# Patient Record
Sex: Female | Born: 1945 | Race: White | Smoking: Current every day smoker
Health system: Southern US, Community
[De-identification: ages and names within clinical notes are randomized; demographics above are authoritative.]

## PROBLEM LIST (undated history)

## (undated) DIAGNOSIS — J45909 Unspecified asthma, uncomplicated: Secondary | ICD-10-CM

## (undated) DIAGNOSIS — J449 Chronic obstructive pulmonary disease, unspecified: Secondary | ICD-10-CM

## (undated) DIAGNOSIS — I10 Essential (primary) hypertension: Secondary | ICD-10-CM

---

## 2015-03-24 ENCOUNTER — Other Ambulatory Visit: Payer: Self-pay | Admitting: Nurse Practitioner

## 2015-03-24 DIAGNOSIS — Z0000 Encounter for general adult medical examination without abnormal findings: Secondary | ICD-10-CM

## 2016-02-16 ENCOUNTER — Other Ambulatory Visit: Payer: Self-pay | Admitting: Nurse Practitioner

## 2016-02-16 DIAGNOSIS — Z1239 Encounter for other screening for malignant neoplasm of breast: Secondary | ICD-10-CM

## 2016-10-07 ENCOUNTER — Emergency Department: Payer: Medicare Other

## 2016-10-07 ENCOUNTER — Encounter: Payer: Self-pay | Admitting: Emergency Medicine

## 2016-10-07 ENCOUNTER — Emergency Department
Admission: EM | Admit: 2016-10-07 | Discharge: 2016-10-07 | Disposition: A | Discharge status: Home / Self Care | Payer: Medicare Other | Attending: Emergency Medicine | Admitting: Emergency Medicine

## 2016-10-07 DIAGNOSIS — Y999 Unspecified external cause status: Secondary | ICD-10-CM | POA: Diagnosis not present

## 2016-10-07 DIAGNOSIS — Y929 Unspecified place or not applicable: Secondary | ICD-10-CM | POA: Insufficient documentation

## 2016-10-07 DIAGNOSIS — S6991XA Unspecified injury of right wrist, hand and finger(s), initial encounter: Secondary | ICD-10-CM | POA: Diagnosis present

## 2016-10-07 DIAGNOSIS — S52501A Unspecified fracture of the lower end of right radius, initial encounter for closed fracture: Secondary | ICD-10-CM

## 2016-10-07 DIAGNOSIS — W03XXXA Other fall on same level due to collision with another person, initial encounter: Secondary | ICD-10-CM | POA: Insufficient documentation

## 2016-10-07 DIAGNOSIS — Y9301 Activity, walking, marching and hiking: Secondary | ICD-10-CM | POA: Diagnosis not present

## 2016-10-07 DIAGNOSIS — I10 Essential (primary) hypertension: Secondary | ICD-10-CM | POA: Insufficient documentation

## 2016-10-07 DIAGNOSIS — F17210 Nicotine dependence, cigarettes, uncomplicated: Secondary | ICD-10-CM | POA: Diagnosis not present

## 2016-10-07 DIAGNOSIS — S52591A Other fractures of lower end of right radius, initial encounter for closed fracture: Secondary | ICD-10-CM | POA: Insufficient documentation

## 2016-10-07 HISTORY — DX: Essential (primary) hypertension: I10

## 2016-10-07 MED ORDER — OXYCODONE-ACETAMINOPHEN 5-325 MG PO TABS
1.0000 | ORAL_TABLET | Freq: Once | ORAL | Status: AC
  Administered 2016-10-07: 1 via ORAL
  Filled 2016-10-07: qty 1

## 2016-10-07 MED ORDER — OXYCODONE-ACETAMINOPHEN 7.5-325 MG PO TABS
1.0000 | ORAL_TABLET | ORAL | 0 refills | Status: AC | PRN
Start: 2016-10-07 — End: 2017-10-07

## 2016-10-07 NOTE — ED Provider Notes (Signed)
[facility] Emergency Department Provider Note ____________________________________________  Time seen: Approximately 11:10 PM  I have reviewed the triage vital signs and the nursing notes.   HISTORY  Chief Complaint Wrist Pain   HPI Mariah Nixon is a 70 y.o. female that presents with right arm pain after falling on arm one week ago while walking neighbor's dogs. Patient states that dogs pulled her down while on a walk. Patient's son helped her stand up. Patient has been moving arm normally all week. Patient states that swelling and bruising have been getting worse all week. Patient has normal sensation in fingers. Patient denies numbness or tingling. Patient's sister was concerned and brought patient into ED. Patient denies hitting head.Patient did not loose consciousness. Patient has not taken anything for pain.  Past Medical History:  Diagnosis Date  . Hypertension     There are no active problems to display for this patient.   History reviewed. No pertinent surgical history.  Prior to Admission medications   Medication Sig Start Date End Date Taking? Authorizing Provider  oxyCODONE-acetaminophen (PERCOCET) 7.5-325 MG tablet Take 1 tablet by mouth every 4 (four) hours as needed for severe pain. 10/07/16 10/07/17  Enid DerryAshley Aella Ronda, PA-C    Allergies Patient has no known allergies.  No family history on file.  Social History Social History  Substance Use Topics  . Smoking status: Current Every Day Smoker    Packs/day: 1.00    Types: Cigarettes  . Smokeless tobacco: Not on file  . Alcohol use Not on file    Review of Systems Constitutional: No recent illness. Cardiovascular: Denies chest pain or palpitations. Respiratory: Denies shortness of breath. Abdomen: Negative for abdominal pain. Musculoskeletal: Pain in right arm. Skin: Negative for rash, wound, lesion. Positive for bruising.  Neurological: Negative for tingling or numbness. No  headaches.  ____________________________________________   PHYSICAL EXAM:  VITAL SIGNS: ED Triage Vitals  Enc Vitals Group     BP 10/07/16 1857 (!) 148/73     Pulse Rate 10/07/16 1857 (!) 104     Resp 10/07/16 1857 20     Temp 10/07/16 1857 98.4 F (36.9 C)     Temp Source 10/07/16 1857 Oral     SpO2 10/07/16 1857 95 %     Weight 10/07/16 1858 79 lb (35.8 kg)     Height 10/07/16 1858 4\' 9"  (1.448 m)     Head Circumference --      Peak Flow --      Pain Score 10/07/16 1858 9     Pain Loc --      Pain Edu? --      Excl. in GC? --     Constitutional: Alert and oriented. Well appearing and in no acute distress. Eyes: Conjunctivae are normal. EOMI. Head: Atraumatic. Neck: No stridor.  Cardiovascular: Regular rate and rhythm. 2+ radial pulses bilaterally.  Respiratory: Normal respiratory effort.  Lungs CTAB. Musculoskeletal: Moderate swelling in right hand. Bruising over right forearm. Patient able to move fingers and elbow normally. Limited ROM of right wrist. Tenderness to palpation over right wrist. Neurologic:  Normal speech and language. No gross focal neurologic deficits are appreciated. Sensation in right arm and hand intact. Speech is normal.  Skin:  Skin is warm, dry and intact. Mild swelling in right hand. Psychiatric: Mood and affect are normal. Speech and behavior are normal.  ____________________________________________   LABS (all labs ordered are listed, but only abnormal results are displayed)  Labs Reviewed - No data  to display ____________________________________________  RADIOLOGY  Lexine BatonI, Teryn Gust, personally viewed and evaluated these images (plain radiographs) as part of my medical decision making, as well as reviewing the written report by the radiologist.  Xray findings per radiology. Comminuted fracture of the distal radius, with mild impaction and  dorsal angulation.     PROCEDURES    INITIAL IMPRESSION / ASSESSMENT AND PLAN / ED  COURSE  Clinical Course     Pertinent labs & imaging results that were available during my care of the patient were reviewed by me and considered in my medical decision making (see chart for details).  Patient has fracture of the distal radius, with mild impaction and dorsal angulation. Pulses and sensation are intact. Pulse and blood pressure are likely secondary to pain. Swelling of hand is likely from patient continuing to use hand all week. Patient was given splint and sling in ED. Patient was instructed not to remove sling or splint. Patient was given Percocet for pain. Patient was instructed to follow-up with ortho on Monday. Patient was instructed to return to ED for any sensation changes and hand. ____________________________________________   FINAL CLINICAL IMPRESSION(S) / ED DIAGNOSES  Final diagnoses:  Closed fracture of distal end of right radius, unspecified fracture morphology, initial encounter      Enid Derryshley Thierry Dobosz, PA-C 10/08/16 0009    Minna AntisKevin Paduchowski, MD 10/08/16 2237

## 2016-10-07 NOTE — ED Triage Notes (Signed)
Larey SeatFell one week ago. Pain R wrist. Wrist noted swollen and bruised.

## 2016-10-07 NOTE — Discharge Instructions (Signed)
Keep wrist in splint and sling. Call for an appointment with Ortho on Monday. Return to ED for any sensation changes in fingers or any change in symptoms.

## 2017-08-24 ENCOUNTER — Other Ambulatory Visit: Payer: Self-pay | Admitting: Nurse Practitioner

## 2017-08-24 DIAGNOSIS — Z1239 Encounter for other screening for malignant neoplasm of breast: Secondary | ICD-10-CM

## 2017-08-24 DIAGNOSIS — Z13820 Encounter for screening for osteoporosis: Secondary | ICD-10-CM

## 2018-10-23 ENCOUNTER — Other Ambulatory Visit: Payer: Self-pay | Admitting: Nurse Practitioner

## 2018-10-23 DIAGNOSIS — Z1239 Encounter for other screening for malignant neoplasm of breast: Secondary | ICD-10-CM

## 2018-10-23 DIAGNOSIS — Z1231 Encounter for screening mammogram for malignant neoplasm of breast: Secondary | ICD-10-CM

## 2018-10-23 DIAGNOSIS — M810 Age-related osteoporosis without current pathological fracture: Secondary | ICD-10-CM

## 2018-10-24 ENCOUNTER — Telehealth: Payer: Self-pay | Admitting: *Deleted

## 2018-10-24 DIAGNOSIS — Z122 Encounter for screening for malignant neoplasm of respiratory organs: Secondary | ICD-10-CM

## 2018-10-24 DIAGNOSIS — Z87891 Personal history of nicotine dependence: Secondary | ICD-10-CM

## 2018-10-24 NOTE — Telephone Encounter (Signed)
Received referral for initial lung cancer screening scan. Contacted patient and obtained smoking history,(current, 41 pack year) as well as answering questions related to screening process. Patient denies signs of lung cancer such as weight loss or hemoptysis. Patient denies comorbidity that would prevent curative treatment if lung cancer were found. Patient is scheduled for shared decision making visit and CT scan on 11/12/18 at 115pm.

## 2018-11-12 ENCOUNTER — Inpatient Hospital Stay: Payer: Medicare Other | Attending: Nurse Practitioner | Admitting: Nurse Practitioner

## 2018-11-12 ENCOUNTER — Encounter: Payer: Self-pay | Admitting: Nurse Practitioner

## 2018-11-12 ENCOUNTER — Ambulatory Visit
Admission: RE | Admit: 2018-11-12 | Discharge: 2018-11-12 | Disposition: A | Discharge status: Home / Self Care | Payer: Medicare Other | Source: Ambulatory Visit | Attending: Nurse Practitioner | Admitting: Nurse Practitioner

## 2018-11-12 DIAGNOSIS — Z122 Encounter for screening for malignant neoplasm of respiratory organs: Secondary | ICD-10-CM | POA: Diagnosis not present

## 2018-11-12 DIAGNOSIS — Z87891 Personal history of nicotine dependence: Secondary | ICD-10-CM | POA: Diagnosis present

## 2018-11-12 NOTE — Progress Notes (Signed)
In accordance with CMS guidelines, patient has met eligibility criteria including age, absence of signs or symptoms of lung cancer.  Social History   Tobacco Use  . Smoking status: Current Every Day Smoker    Packs/day: 1.00    Years: 41.00    Pack years: 41.00    Types: Cigarettes  Substance Use Topics  . Alcohol use: Not on file  . Drug use: Not on file      A shared decision-making session was conducted prior to the performance of CT scan. This includes one or more decision aids, includes benefits and harms of screening, follow-up diagnostic testing, over-diagnosis, false positive rate, and total radiation exposure.   Counseling on the importance of adherence to annual lung cancer LDCT screening, impact of co-morbidities, and ability or willingness to undergo diagnosis and treatment is imperative for compliance of the program.   Counseling on the importance of continued smoking cessation for former smokers; the importance of smoking cessation for current smokers, and information about tobacco cessation interventions have been given to patient including [place] and [employer] quit [place] programs.   Written order for lung cancer screening with LDCT has been given to the patient and any and all questions have been answered to the best of my abilities.    Yearly follow up will be coordinated by Burgess Estelle, Thoracic Navigator.  Beckey Rutter, DNP, AGNP-C [place] at [facility] [phone] (work cell) [phone] (office) 11/12/18 2:24 PM

## 2018-11-14 ENCOUNTER — Telehealth: Payer: Self-pay | Admitting: *Deleted

## 2018-11-14 NOTE — Telephone Encounter (Signed)
Notified patient of LDCT lung cancer screening program results with recommendation short term follow up imaging after appropriate treatment. Also faxed report to PCP and notified their office of results.   IMPRESSION:  1. Lung-RADS 0S, incomplete. Today's study is considered  nondiagnostic for purposes of lung cancer screening by the presence  of an apparent multilobar bronchopneumonia. Outpatient antimicrobial  treatment is recommended, followed by repeat lung cancer screening  examination in 3 months after complete resolution of the patient's  symptoms and normalization of the patient's chest radiograph.  2. The "S" modifier above refers to potentially clinically  significant non lung cancer related findings. Specifically, there is  aortic atherosclerosis, in addition to left main and 3 vessel  coronary artery disease. Assessment for potential risk factor  modification, dietary therapy or pharmacologic therapy may be  warranted, if clinically indicated.  3. There are calcifications of the mitral annulus. Echocardiographic  correlation for evaluation of potential valvular dysfunction may be  warranted if clinically indicated.  Aortic Atherosclerosis (ICD10-I70.0).

## 2018-11-20 ENCOUNTER — Telehealth: Payer: Self-pay

## 2018-11-20 NOTE — Telephone Encounter (Signed)
L MOM to schedule appt per PCP

## 2018-12-03 NOTE — Telephone Encounter (Signed)
lmov to schedule appt  °

## 2018-12-07 ENCOUNTER — Telehealth: Payer: Self-pay

## 2018-12-07 NOTE — Telephone Encounter (Signed)
Call pt regarding lung screening. Pt has no transportation. Pt would like for Shawn to call her before scheduling this appt.  I explain that sometimes we maybe able to place pts on our van as a 1 time event. Pt is a current smoker, smoking about 1/2 pack per day. Would like to have scan in the afternoon. Pt denies any new health issues.

## 2018-12-11 ENCOUNTER — Encounter: Payer: Self-pay | Admitting: *Deleted

## 2018-12-11 ENCOUNTER — Telehealth: Payer: Self-pay | Admitting: *Deleted

## 2018-12-11 DIAGNOSIS — Z122 Encounter for screening for malignant neoplasm of respiratory organs: Secondary | ICD-10-CM

## 2018-12-11 NOTE — Telephone Encounter (Signed)
Patient has been notified that the annual lung cancer screening low dose CT scan is due currently or will be in the near future.  Confirmed that the patient is within the age range of 79-80, and asymptomatic, and currently exhibits no signs or symptoms of lung cancer.  Patient denies illness that would prevent curative treatment for lung cancer if found.  Verified smoking history, current smoker 0.5ppd with 42pkyr history.  The shared decision making visit was completed on 11-12-18.  Patient is agreeable for the CT scan to be scheduled.  Will call patient back with date and time of appointment.

## 2018-12-11 NOTE — Telephone Encounter (Signed)
Called pt to inform her of her appt for ldct screening on Thursday 12/26/2018 here @ [facility] @ 2:15pm and that the cancer center [facility] will pick you up at 1330, unable to reach patient at this time, voice mail left and appt mailed to patient.

## 2018-12-13 NOTE — Telephone Encounter (Signed)
Unable to contact mailed letter removing from wq.  Faxed to pcp office to notify

## 2018-12-26 ENCOUNTER — Ambulatory Visit
Admission: RE | Admit: 2018-12-26 | Discharge: 2018-12-26 | Disposition: A | Discharge status: Home / Self Care | Payer: Medicare Other | Source: Ambulatory Visit | Attending: Nurse Practitioner | Admitting: Nurse Practitioner

## 2018-12-30 ENCOUNTER — Telehealth: Payer: Self-pay | Admitting: *Deleted

## 2018-12-30 NOTE — Telephone Encounter (Signed)
Voicemail left in attempt to reschedule no show appointment for lung screening follow up imaging.

## 2019-01-04 ENCOUNTER — Telehealth: Payer: Self-pay | Admitting: *Deleted

## 2019-01-04 ENCOUNTER — Encounter: Payer: Self-pay | Admitting: *Deleted

## 2019-01-04 NOTE — Telephone Encounter (Signed)
Attempted to contact patient r/t LDCT Screening follow up due at this time.  No answer received, message left for patient to call [phone] to schedule appointment.  Letter mailed r/t inability to reach and no show to appt.

## 2019-02-17 ENCOUNTER — Other Ambulatory Visit: Payer: Self-pay

## 2019-02-17 ENCOUNTER — Emergency Department: Payer: Medicare Other

## 2019-02-17 ENCOUNTER — Inpatient Hospital Stay
Admission: EM | Admit: 2019-02-17 | Discharge: 2019-03-14 | DRG: 193 | Disposition: E | Payer: Medicare Other | Attending: Internal Medicine | Admitting: Internal Medicine

## 2019-02-17 DIAGNOSIS — Z515 Encounter for palliative care: Secondary | ICD-10-CM | POA: Diagnosis not present

## 2019-02-17 DIAGNOSIS — Z79899 Other long term (current) drug therapy: Secondary | ICD-10-CM

## 2019-02-17 DIAGNOSIS — J189 Pneumonia, unspecified organism: Principal | ICD-10-CM | POA: Diagnosis present

## 2019-02-17 DIAGNOSIS — J9621 Acute and chronic respiratory failure with hypoxia: Secondary | ICD-10-CM | POA: Diagnosis not present

## 2019-02-17 DIAGNOSIS — F05 Delirium due to known physiological condition: Secondary | ICD-10-CM | POA: Diagnosis not present

## 2019-02-17 DIAGNOSIS — N179 Acute kidney failure, unspecified: Secondary | ICD-10-CM | POA: Diagnosis not present

## 2019-02-17 DIAGNOSIS — M810 Age-related osteoporosis without current pathological fracture: Secondary | ICD-10-CM | POA: Diagnosis present

## 2019-02-17 DIAGNOSIS — E871 Hypo-osmolality and hyponatremia: Secondary | ICD-10-CM | POA: Diagnosis present

## 2019-02-17 DIAGNOSIS — E872 Acidosis: Secondary | ICD-10-CM | POA: Diagnosis not present

## 2019-02-17 DIAGNOSIS — M47816 Spondylosis without myelopathy or radiculopathy, lumbar region: Secondary | ICD-10-CM | POA: Diagnosis present

## 2019-02-17 DIAGNOSIS — R64 Cachexia: Secondary | ICD-10-CM | POA: Diagnosis present

## 2019-02-17 DIAGNOSIS — M419 Scoliosis, unspecified: Secondary | ICD-10-CM | POA: Diagnosis present

## 2019-02-17 DIAGNOSIS — E43 Unspecified severe protein-calorie malnutrition: Secondary | ICD-10-CM | POA: Diagnosis present

## 2019-02-17 DIAGNOSIS — Z8701 Personal history of pneumonia (recurrent): Secondary | ICD-10-CM | POA: Diagnosis not present

## 2019-02-17 DIAGNOSIS — E222 Syndrome of inappropriate secretion of antidiuretic hormone: Secondary | ICD-10-CM | POA: Diagnosis present

## 2019-02-17 DIAGNOSIS — F1010 Alcohol abuse, uncomplicated: Secondary | ICD-10-CM | POA: Diagnosis present

## 2019-02-17 DIAGNOSIS — J9602 Acute respiratory failure with hypercapnia: Secondary | ICD-10-CM | POA: Diagnosis not present

## 2019-02-17 DIAGNOSIS — M25551 Pain in right hip: Secondary | ICD-10-CM | POA: Diagnosis present

## 2019-02-17 DIAGNOSIS — I361 Nonrheumatic tricuspid (valve) insufficiency: Secondary | ICD-10-CM | POA: Diagnosis not present

## 2019-02-17 DIAGNOSIS — Z66 Do not resuscitate: Secondary | ICD-10-CM | POA: Diagnosis not present

## 2019-02-17 DIAGNOSIS — G9340 Encephalopathy, unspecified: Secondary | ICD-10-CM | POA: Diagnosis not present

## 2019-02-17 DIAGNOSIS — J440 Chronic obstructive pulmonary disease with acute lower respiratory infection: Secondary | ICD-10-CM | POA: Diagnosis present

## 2019-02-17 DIAGNOSIS — J9690 Respiratory failure, unspecified, unspecified whether with hypoxia or hypercapnia: Secondary | ICD-10-CM

## 2019-02-17 DIAGNOSIS — Z6821 Body mass index (BMI) 21.0-21.9, adult: Secondary | ICD-10-CM

## 2019-02-17 DIAGNOSIS — R0689 Other abnormalities of breathing: Secondary | ICD-10-CM

## 2019-02-17 DIAGNOSIS — I10 Essential (primary) hypertension: Secondary | ICD-10-CM | POA: Diagnosis present

## 2019-02-17 DIAGNOSIS — Z886 Allergy status to analgesic agent status: Secondary | ICD-10-CM

## 2019-02-17 DIAGNOSIS — Z716 Tobacco abuse counseling: Secondary | ICD-10-CM

## 2019-02-17 DIAGNOSIS — J441 Chronic obstructive pulmonary disease with (acute) exacerbation: Secondary | ICD-10-CM | POA: Diagnosis present

## 2019-02-17 DIAGNOSIS — F17210 Nicotine dependence, cigarettes, uncomplicated: Secondary | ICD-10-CM | POA: Diagnosis present

## 2019-02-17 DIAGNOSIS — J9622 Acute and chronic respiratory failure with hypercapnia: Secondary | ICD-10-CM | POA: Diagnosis not present

## 2019-02-17 DIAGNOSIS — K0889 Other specified disorders of teeth and supporting structures: Secondary | ICD-10-CM | POA: Diagnosis present

## 2019-02-17 DIAGNOSIS — R0602 Shortness of breath: Secondary | ICD-10-CM

## 2019-02-17 DIAGNOSIS — I340 Nonrheumatic mitral (valve) insufficiency: Secondary | ICD-10-CM | POA: Diagnosis not present

## 2019-02-17 DIAGNOSIS — Z7189 Other specified counseling: Secondary | ICD-10-CM | POA: Diagnosis not present

## 2019-02-17 HISTORY — DX: Unspecified asthma, uncomplicated: J45.909

## 2019-02-17 HISTORY — DX: Chronic obstructive pulmonary disease, unspecified: J44.9

## 2019-02-17 LAB — COMPREHENSIVE METABOLIC PANEL
ALT: 13 U/L (ref 0–44)
AST: 26 U/L (ref 15–41)
Albumin: 3.2 g/dL — ABNORMAL LOW (ref 3.5–5.0)
Alkaline Phosphatase: 115 U/L (ref 38–126)
Anion gap: 14 (ref 5–15)
BUN: 6 mg/dL — ABNORMAL LOW (ref 8–23)
CO2: 23 mmol/L (ref 22–32)
Calcium: 8.6 mg/dL — ABNORMAL LOW (ref 8.9–10.3)
Chloride: 75 mmol/L — ABNORMAL LOW (ref 98–111)
Creatinine, Ser: <0.3 mg/dL — ABNORMAL LOW (ref 0.44–1.00)
Glucose, Bld: 105 mg/dL — ABNORMAL HIGH (ref 70–99)
Potassium: 4.2 mmol/L (ref 3.5–5.1)
Sodium: 112 mmol/L — CL (ref 135–145)
Total Bilirubin: 1.4 mg/dL — ABNORMAL HIGH (ref 0.3–1.2)
Total Protein: 7 g/dL (ref 6.5–8.1)

## 2019-02-17 LAB — CBC WITH DIFFERENTIAL/PLATELET
Abs Immature Granulocytes: 0.12 10*3/uL — ABNORMAL HIGH (ref 0.00–0.07)
Basophils Absolute: 0.1 10*3/uL (ref 0.0–0.1)
Basophils Relative: 0 %
Eosinophils Absolute: 0.1 10*3/uL (ref 0.0–0.5)
Eosinophils Relative: 1 %
HCT: 37.6 % (ref 36.0–46.0)
Hemoglobin: 13.6 g/dL (ref 12.0–15.0)
Immature Granulocytes: 1 %
Lymphocytes Relative: 8 %
Lymphs Abs: 1.1 10*3/uL (ref 0.7–4.0)
MCH: 32.5 pg (ref 26.0–34.0)
MCHC: 36.2 g/dL — ABNORMAL HIGH (ref 30.0–36.0)
MCV: 89.7 fL (ref 80.0–100.0)
Monocytes Absolute: 1.8 10*3/uL — ABNORMAL HIGH (ref 0.1–1.0)
Monocytes Relative: 14 %
Neutro Abs: 10.1 10*3/uL — ABNORMAL HIGH (ref 1.7–7.7)
Neutrophils Relative %: 76 %
Platelets: 349 10*3/uL (ref 150–400)
RBC: 4.19 MIL/uL (ref 3.87–5.11)
RDW: 11.8 % (ref 11.5–15.5)
WBC: 13.2 10*3/uL — ABNORMAL HIGH (ref 4.0–10.5)
nRBC: 0 % (ref 0.0–0.2)

## 2019-02-17 LAB — OSMOLALITY, URINE: Osmolality, Ur: 494 mOsm/kg (ref 300–900)

## 2019-02-17 LAB — BRAIN NATRIURETIC PEPTIDE: B Natriuretic Peptide: 362 pg/mL — ABNORMAL HIGH (ref 0.0–100.0)

## 2019-02-17 LAB — SODIUM: Sodium: 111 mmol/L — CL (ref 135–145)

## 2019-02-17 LAB — PROCALCITONIN: Procalcitonin: <0.1 ng/mL

## 2019-02-17 LAB — SODIUM, URINE, RANDOM: Sodium, Ur: <10 mmol/L

## 2019-02-17 LAB — OSMOLALITY: Osmolality: 230 mOsm/kg — CL (ref 275–295)

## 2019-02-17 MED ORDER — SODIUM CHLORIDE 0.9 % IV SOLN
500.0000 mg | INTRAVENOUS | Status: DC
  Administered 2019-02-18 – 2019-02-21 (×4): 500 mg via INTRAVENOUS
  Filled 2019-02-17 (×6): qty 500

## 2019-02-17 MED ORDER — SODIUM CHLORIDE 0.9 % IV SOLN
1.0000 g | INTRAVENOUS | Status: DC
  Administered 2019-02-17 – 2019-02-21 (×5): 1 g via INTRAVENOUS
  Filled 2019-02-17: qty 1
  Filled 2019-02-17 (×2): qty 10
  Filled 2019-02-17: qty 1
  Filled 2019-02-17 (×2): qty 10
  Filled 2019-02-17: qty 1

## 2019-02-17 MED ORDER — ACETAMINOPHEN 650 MG RE SUPP: 650.0000 mg | Freq: Four times a day (QID) | RECTAL | Status: DC | PRN

## 2019-02-17 MED ORDER — SODIUM CHLORIDE 0.9% FLUSH: 3.0000 mL | INTRAVENOUS | Status: DC | PRN

## 2019-02-17 MED ORDER — ACETAMINOPHEN 325 MG PO TABS: 650.0000 mg | ORAL_TABLET | Freq: Four times a day (QID) | ORAL | Status: DC | PRN

## 2019-02-17 MED ORDER — SODIUM CHLORIDE 0.9% FLUSH
3.0000 mL | Freq: Two times a day (BID) | INTRAVENOUS | Status: DC
  Administered 2019-02-17 – 2019-02-21 (×9): 3 mL via INTRAVENOUS

## 2019-02-17 MED ORDER — ENOXAPARIN SODIUM 40 MG/0.4ML ~~LOC~~ SOLN
40.0000 mg | SUBCUTANEOUS | Status: DC
  Administered 2019-02-17: 40 mg via SUBCUTANEOUS
  Filled 2019-02-17: qty 0.4

## 2019-02-17 MED ORDER — SODIUM CHLORIDE 0.9 % IV SOLN
INTRAVENOUS | Status: DC
  Administered 2019-02-17 – 2019-02-18 (×2): via INTRAVENOUS

## 2019-02-17 MED ORDER — ONDANSETRON HCL 4 MG/2ML IJ SOLN
4.0000 mg | Freq: Four times a day (QID) | INTRAMUSCULAR | Status: DC | PRN
  Administered 2019-02-18 (×2): 4 mg via INTRAVENOUS
  Filled 2019-02-17 (×2): qty 2

## 2019-02-17 MED ORDER — SENNOSIDES-DOCUSATE SODIUM 8.6-50 MG PO TABS
1.0000 | ORAL_TABLET | Freq: Every evening | ORAL | Status: DC | PRN
  Administered 2019-02-20: 1 via ORAL
  Filled 2019-02-17: qty 1

## 2019-02-17 MED ORDER — ENOXAPARIN SODIUM 30 MG/0.3ML ~~LOC~~ SOLN
30.0000 mg | SUBCUTANEOUS | Status: DC
  Administered 2019-02-18 – 2019-02-21 (×4): 30 mg via SUBCUTANEOUS
  Filled 2019-02-17 (×4): qty 0.3

## 2019-02-17 MED ORDER — NICOTINE 21 MG/24HR TD PT24
21.0000 mg | MEDICATED_PATCH | Freq: Every day | TRANSDERMAL | Status: DC
  Administered 2019-02-17 – 2019-02-21 (×5): 21 mg via TRANSDERMAL
  Filled 2019-02-17 (×5): qty 1

## 2019-02-17 MED ORDER — ONDANSETRON HCL 4 MG PO TABS: 4.0000 mg | ORAL_TABLET | Freq: Four times a day (QID) | ORAL | Status: DC | PRN

## 2019-02-17 MED ORDER — SODIUM CHLORIDE 0.9 % IV SOLN: 250.0000 mL | INTRAVENOUS | Status: DC | PRN

## 2019-02-17 NOTE — ED Notes (Signed)
Date and time results received: 28-Feb-2019 1737  Test: sodium Critical Value: 112  Name of Provider Notified: Dr. Darnelle Catalan  Orders Received? Or Actions Taken?: no new orders at this time

## 2019-02-17 NOTE — ED Notes (Signed)
Patient transported to X-ray 

## 2019-02-17 NOTE — ED Provider Notes (Signed)
[facility] Emergency Department Provider Note   ____________________________________________   First MD Initiated Contact with Patient 02/20/2019 1537     (approximate)  I have reviewed the triage vital signs and the nursing notes.   HISTORY  Chief Complaint Hip Pain   HPI Mariah Nixon is a 73 y.o. female who complains of right hip pain for a week.  Says it hurts to walk.  She did not fall or injure it in any way.  She has no rash or fever.  Walking makes it worse.  Palpation does not make it hurt.  Pain is  moderately severe when she walks is a understand it is deep and achy.  Is made worse with walking goes away if she does not walk.        Past Medical History:  Diagnosis Date  . Asthma   . COPD (chronic obstructive pulmonary disease) (HCC)   . Hypertension     There are no active problems to display for this patient.   Past Surgical History:  Procedure Laterality Date  . CESAREAN SECTION      Prior to Admission medications   Not on File    Allergies Patient has no known allergies.  History reviewed. No pertinent family history.  Social History Social History   Tobacco Use  . Smoking status: Current Every Day Smoker    Packs/day: 0.50    Years: 42.00    Pack years: 21.00    Types: Cigarettes  . Smokeless tobacco: Never Used  Substance Use Topics  . Alcohol use: Yes    Alcohol/week: 2.0 standard drinks    Types: 2 Cans of beer per week  . Drug use: Never    Review of Systems  Constitutional: No fever/chills Eyes: No visual changes. ENT: No sore throat. Cardiovascular: Denies chest pain. Respiratory: Denies shortness of breath. Gastrointestinal: No abdominal pain.  No nausea, no vomiting.  No diarrhea.  No constipation. Genitourinary: Negative for dysuria. Musculoskeletal: Negative for back pain. Skin: Negative for rash. Neurological: Negative for headaches, focal weakness    ____________________________________________   PHYSICAL EXAM:  VITAL SIGNS: ED Triage Vitals  Enc Vitals Group     BP      Pulse      Resp      Temp      Temp src      SpO2      Weight      Height      Head Circumference      Peak Flow      Pain Score      Pain Loc      Pain Edu?      Excl. in GC?     Constitutional: Alert and oriented. Well appearing and in no acute distress. Eyes: Conjunctivae are normal.  Head: Atraumatic. Nose: No congestion/rhinnorhea. Mouth/Throat: Mucous membranes are moist.  Oropharynx non-erythematous. Neck: No stridor. Cardiovascular: Normal rate, regular rhythm. Grossly normal heart sounds.  Good peripheral circulation. Respiratory: Normal respiratory effort.  No retractions. Lungs CTAB. Gastrointestinal: Soft and nontender. No distention. No abdominal bruits. No CVA tenderness. }Musculoskeletal: No lower extremity tenderness nor edema.  Neurologic:  Normal speech and language. No gross focal neurologic deficits are appreciated. Skin:  Skin is warm, dry and intact. No rash noted.   ____________________________________________   LABS (all labs ordered are listed, but only abnormal results are displayed)  Labs Reviewed  COMPREHENSIVE METABOLIC PANEL - Abnormal; Notable for the following components:  Result Value   Sodium 112 (*)    Chloride 75 (*)    Glucose, Bld 105 (*)    BUN 6 (*)    Creatinine, Ser <0.30 (*)    Calcium 8.6 (*)    Albumin 3.2 (*)    Total Bilirubin 1.4 (*)    All other components within normal limits  CBC WITH DIFFERENTIAL/PLATELET - Abnormal; Notable for the following components:   WBC 13.2 (*)    MCHC 36.2 (*)    Neutro Abs 10.1 (*)    Monocytes Absolute 1.8 (*)    Abs Immature Granulocytes 0.12 (*)    All other components within normal limits  BRAIN NATRIURETIC PEPTIDE - Abnormal; Notable for the following components:   B Natriuretic Peptide 362.0 (*)    All other components within normal limits    ____________________________________________  EKG  ____________________________________________  RADIOLOGY  ED MD interpretation:    Official radiology report(s): Dg Chest 2 View  Result Date: 02/14/2019 CLINICAL DATA:  Back pain 1 week.  No injury.  Difficulty breathing. EXAM: CHEST - 2 VIEW COMPARISON:  Chest CT 11/12/2018 FINDINGS: Lungs are adequately inflated demonstrate bibasilar opacification with small right pleural effusion. Findings may be due to infection. There is cardiomegaly as well as mild prominence of the perihilar markings suggesting mild vascular congestion. Old left lower posterior rib fractures. Severe curvature of the thoracolumbar spine convex right with degenerative changes. IMPRESSION: Bibasilar opacification which may be due to infection with small right pleural effusion. Possible component of mild vascular congestion. Cardiomegaly. Electronically Signed   By: Elberta Fortis M.D.   On: 03/03/2019 16:36   Dg Lumbar Spine Complete  Result Date: 03/02/2019 CLINICAL DATA:  Low back pain 1 week.  No injury. EXAM: LUMBAR SPINE - COMPLETE 4+ VIEW COMPARISON:  None. FINDINGS: There is severe curvature of the thoracolumbar spine convex right. It is difficult to evaluate the lateral films due to the significant curvature present. There is moderate spondylosis of the spine. No definite compression fracture or subluxation. Calcified T11-12 disc. IMPRESSION: No acute findings. 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 Electronically Signed   By: Elberta Fortis M.D.   On: 02/12/2019 16:29   Dg Hip Unilat W Or Wo Pelvis 2-3 Views Right  Result Date: 02/21/2019 CLINICAL DATA:  Right hip and back pain 1 week.  No injury. EXAM: DG HIP (WITH OR WITHOUT PELVIS) 2-3V RIGHT COMPARISON:  None. FINDINGS: Diffuse decreased bone mineralization is present. There mild symmetric degenerative changes of the hips. There is no acute fracture or  dislocation. Moderate degenerative change over the lumbosacral spine. Calcified plaque over the iliac and femoral arteries. IMPRESSION: No acute findings. Electronically Signed   By: Elberta Fortis M.D.   On: 03/05/2019 16:25    ____________________________________________   PROCEDURES  Procedure(s) performed (including Critical Care):  Procedures   ____________________________________________   INITIAL IMPRESSION / ASSESSMENT AND PLAN / ED COURSE  Patient with hyponatremia to 112.  We will get her in the hospital.  We will start some saline.  She additionally has a white count and some haziness on the chest x-ray.  She has no fever though.  She is not coughing.  I have to investigate this further.             ____________________________________________   FINAL CLINICAL IMPRESSION(S) / ED DIAGNOSES  Final diagnoses:  Hyponatremia  Right hip pain     ED Discharge Orders    None  Note:  This document was prepared using Dragon voice recognition software and may include unintentional dictation errors.    Arnaldo Natal, MD 03/08/2019 [phone]

## 2019-02-17 NOTE — ED Triage Notes (Signed)
Pt BIB EMS from home for Right hip pain x 1 week, no denies any injury. " I think I just over worked it". No obvious deformity, strong distal pulses.

## 2019-02-17 NOTE — Progress Notes (Addendum)
Pharmacy lovenox dose adjustment. Patient's TBW 37.2 kg and ordered vte prophylaxis w/ lovenox 40 mg daily subq.  Will reduce dose to lovenox 30 mg daily subq. CrCl unknown w/ Scr < 0.3 d/t serum osmolarity 230 mOsm/L  Thomasene Ripple, PharmD, BCPS Clinical Pharmacist 26-Feb-2019

## 2019-02-17 NOTE — ED Notes (Signed)
ED TO INPATIENT HANDOFF REPORT  ED Nurse Name and Phone #:  Terance Hart [phone]  S Name/Age/Gender Mariah Nixon 73 y.o. female Room/Bed: ED08A/ED08A  Code Status   Code Status: Not on file  Home/SNF/Other Home Patient oriented to: self, place, time and situation Is this baseline? Yes   Triage Complete: Triage complete  Chief Complaint Rt hip pain  Triage Note Pt BIB EMS from home for Right hip pain x 1 week, no denies any injury. " I think I just over worked it". No obvious deformity, strong distal pulses.    Allergies No Known Allergies  Level of Care/Admitting Diagnosis ED Disposition    ED Disposition Condition Comment   Admit  Hospital Area: [facility] [[id number]]  Level of Care: Stepdown [14]  Diagnosis: Hyponatremia [147829]  Admitting Physician: Ihor Austin [[id number]]  Attending Physician: Ihor Austin [[id number]]  Estimated length of stay: past midnight tomorrow  Certification:: I certify this patient will need inpatient services for at least 2 midnights  PT Class (Do Not Modify): Inpatient [101]  PT Acc Code (Do Not Modify): Private [1]       B Medical/Surgery History Past Medical History:  Diagnosis Date  . Asthma   . COPD (chronic obstructive pulmonary disease) (HCC)   . Hypertension    Past Surgical History:  Procedure Laterality Date  . CESAREAN SECTION       A IV Location/Drains/Wounds Patient Lines/Drains/Airways Status   Active Line/Drains/Airways    Name:   Placement date:   Placement time:   Site:   Days:   Peripheral IV 03/07/2019 Right Antecubital   03/03/2019    1639    Antecubital   less than 1          Intake/Output Last 24 hours No intake or output data in the 24 hours ending 03/13/2019 1925  Labs/Imaging Results for orders placed or performed during the hospital encounter of 03/09/2019 (from the past 48 hour(s))  Comprehensive metabolic panel     Status: Abnormal   Collection Time: 03/03/2019  4:38 PM   Result Value Ref Range   Sodium 112 (LL) 135 - 145 mmol/L    Comment: CRITICAL RESULT CALLED TO, READ BACK BY AND VERIFIED WITH ALICIA GRANGER AT 1737 02/14/2019.  TFK    Potassium 4.2 3.5 - 5.1 mmol/L   Chloride 75 (L) 98 - 111 mmol/L   CO2 23 22 - 32 mmol/L   Glucose, Bld 105 (H) 70 - 99 mg/dL   BUN 6 (L) 8 - 23 mg/dL   Creatinine, Ser <6.96 (L) 0.44 - 1.00 mg/dL   Calcium 8.6 (L) 8.9 - 10.3 mg/dL   Total Protein 7.0 6.5 - 8.1 g/dL   Albumin 3.2 (L) 3.5 - 5.0 g/dL   AST 26 15 - 41 U/L   ALT 13 0 - 44 U/L   Alkaline Phosphatase 115 38 - 126 U/L   Total Bilirubin 1.4 (H) 0.3 - 1.2 mg/dL   GFR calc non Af Amer NOT CALCULATED >60 mL/min   GFR calc Af Amer NOT CALCULATED >60 mL/min   Anion gap 14 5 - 15    Comment: Performed at [facility], [address] [place], [place] [address]  CBC with Differential     Status: Abnormal   Collection Time: 02/20/2019  4:38 PM  Result Value Ref Range   WBC 13.2 (H) 4.0 - 10.5 K/uL   RBC 4.19 3.87 - 5.11 MIL/uL   Hemoglobin 13.6 12.0 - 15.0  g/dL   HCT 07.8 67.5 - 44.9 %   MCV 89.7 80.0 - 100.0 fL   MCH 32.5 26.0 - 34.0 pg   MCHC 36.2 (H) 30.0 - 36.0 g/dL   RDW 20.1 00.7 - 12.1 %   Platelets 349 150 - 400 K/uL   nRBC 0.0 0.0 - 0.2 %   Neutrophils Relative % 76 %   Neutro Abs 10.1 (H) 1.7 - 7.7 K/uL   Lymphocytes Relative 8 %   Lymphs Abs 1.1 0.7 - 4.0 K/uL   Monocytes Relative 14 %   Monocytes Absolute 1.8 (H) 0.1 - 1.0 K/uL   Eosinophils Relative 1 %   Eosinophils Absolute 0.1 0.0 - 0.5 K/uL   Basophils Relative 0 %   Basophils Absolute 0.1 0.0 - 0.1 K/uL   WBC Morphology MORPHOLOGY UNREMARKABLE    RBC Morphology MORPHOLOGY UNREMARKABLE    Smear Review MORPHOLOGY UNREMARKABLE    Immature Granulocytes 1 %   Abs Immature Granulocytes 0.12 (H) 0.00 - 0.07 K/uL    Comment: Performed at [facility], [address] [place], [place] [address]  Brain natriuretic peptide     Status: Abnormal   Collection Time:  03-03-19  4:38 PM  Result Value Ref Range   B Natriuretic Peptide 362.0 (H) 0.0 - 100.0 pg/mL    Comment: Performed at [facility], [address], [place], [place] [address]   Dg Chest 2 View  Result Date: March 03, 2019 CLINICAL DATA:  Back pain 1 week.  No injury.  Difficulty breathing. EXAM: CHEST - 2 VIEW COMPARISON:  Chest CT 11/12/2018 FINDINGS: Lungs are adequately inflated demonstrate bibasilar opacification with small right pleural effusion. Findings may be due to infection. There is cardiomegaly as well as mild prominence of the perihilar markings suggesting mild vascular congestion. Old left lower posterior rib fractures. Severe curvature of the thoracolumbar spine convex right with degenerative changes. IMPRESSION: Bibasilar opacification which may be due to infection with small right pleural effusion. Possible component of mild vascular congestion. Cardiomegaly. Electronically Signed   By: Elberta Fortis M.D.   On: March 03, 2019 16:36   Dg Lumbar Spine Complete  Result Date: Mar 03, 2019 CLINICAL DATA:  Low back pain 1 week.  No injury. EXAM: LUMBAR SPINE - COMPLETE 4+ VIEW COMPARISON:  None. FINDINGS: There is severe curvature of the thoracolumbar spine convex right. It is difficult to evaluate the lateral films due to the significant curvature present. There is moderate spondylosis of the spine. No definite compression fracture or subluxation. Calcified T11-12 disc. IMPRESSION: No acute findings. 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 Electronically Signed   By: Elberta Fortis M.D.   On: March 03, 2019 16:29   Dg Hip Unilat W Or Wo Pelvis 2-3 Views Right  Result Date: 2019/03/03 CLINICAL DATA:  Right hip and back pain 1 week.  No injury. EXAM: DG HIP (WITH OR WITHOUT PELVIS) 2-3V RIGHT COMPARISON:  None. FINDINGS: Diffuse decreased bone mineralization is present. There mild symmetric degenerative changes of the hips. There is no acute  fracture or dislocation. Moderate degenerative change over the lumbosacral spine. Calcified plaque over the iliac and femoral arteries. IMPRESSION: No acute findings. Electronically Signed   By: Elberta Fortis M.D.   On: 03-Mar-2019 16:25    Pending Labs Unresulted Labs (From admission, onward)    Start     Ordered   02/18/19 0033  Sodium  Every 4 hours,   STAT     March 03, 2019 1834  02/24/2019 1833  Sodium  Now then every 2 hours,   STAT     03/05/2019 1834   02/20/2019 1833  Osmolality  Once,   STAT     03/01/2019 1834   03/04/2019 1833  Osmolality, urine  Once,   STAT     02/21/2019 1834   03/12/2019 1833  Sodium, urine, random  Once,   STAT     03/04/2019 1834   Signed and Held  CBC  (enoxaparin (LOVENOX)    CrCl >/= 30 ml/min)  Once,   R    Comments:  Baseline for enoxaparin therapy IF NOT ALREADY DRAWN.  Notify MD if PLT < 100 K.    Signed and Held   Signed and Held  Creatinine, serum  (enoxaparin (LOVENOX)    CrCl >/= 30 ml/min)  Once,   R    Comments:  Baseline for enoxaparin therapy IF NOT ALREADY DRAWN.    Signed and Held   Signed and Held  Creatinine, serum  (enoxaparin (LOVENOX)    CrCl >/= 30 ml/min)  Weekly,   R    Comments:  while on enoxaparin therapy    Signed and Held   Signed and Held  CBC  Tomorrow morning,   R     Signed and Held          Vitals/Pain Today's Vitals   03/08/2019 1540 03/13/2019 1541 03/09/2019 1821  BP: 119/69  111/81  Pulse: 89  (!) 102  Resp: 18  20  Temp: (!) 97.5 F (36.4 C)    TempSrc: Oral    SpO2: 91%  95%  Weight:  37.2 kg   Height:  4\' 6"  (1.372 m)   PainSc: 8       Isolation Precautions No active isolations  Medications Medications  cefTRIAXone (ROCEPHIN) 1 g in sodium chloride 0.9 % 100 mL IVPB (has no administration in time range)  azithromycin (ZITHROMAX) 500 mg in sodium chloride 0.9 % 250 mL IVPB (has no administration in time range)  nicotine (NICODERM CQ - dosed in mg/24 hours) patch 21 mg (has no administration in time range)     Mobility walks Low fall risk   Focused Assessments Neuro Assessment Handoff:  Swallow screen pass? Yes          Neuro Assessment: Within Defined Limits Neuro Checks:      Last Documented NIHSS Modified Score:   Has TPA been given? No If patient is a Neuro Trauma and patient is going to OR before floor call report to 4N Charge nurse: [phone] or [phone]     R Recommendations: See Admitting Provider Note  Report given to:   Additional Notes:  Pt arrives for R hip pain. Xray negative, hx of osteoporosis.

## 2019-02-17 NOTE — H&P (Addendum)
[facility] [place] at [facility]   PATIENT NAME: Mariah Nixon    MR#:  [id number]  DATE OF BIRTH:  11/16/1945  DATE OF ADMISSION:  03/09/2019  PRIMARY CARE PHYSICIAN: Center, [facility]   REQUESTING/REFERRING PHYSICIAN:   CHIEF COMPLAINT:   Chief Complaint  Patient presents with  . Hip Pain    HISTORY OF PRESENT ILLNESS: Mariah Nixon  is a 73 y.o. female with a known history of COPD, hypertension, tobacco abuse presented to the emergency room for hip pain.  Has history of osteoporosis.  Was worked up with hip x-ray which showed no fracture.  She was worked up in the emergency room sodium is 112.  No history of any seizures.  Chest x-ray revealed a pneumonia.  No history of recent travel.  No sick contacts at home.  No sore throat and fever.  Patient has elevated WBC count on work-up in the emergency room.  PAST MEDICAL HISTORY:   Past Medical History:  Diagnosis Date  . Asthma   . COPD (chronic obstructive pulmonary disease) (HCC)   . Hypertension     PAST SURGICAL HISTORY:  Past Surgical History:  Procedure Laterality Date  . CESAREAN SECTION      SOCIAL HISTORY:  Social History   Tobacco Use  . Smoking status: Current Every Day Smoker    Packs/day: 0.50    Years: 42.00    Pack years: 21.00    Types: Cigarettes  . Smokeless tobacco: Never Used  Substance Use Topics  . Alcohol use: Yes    Alcohol/week: 2.0 standard drinks    Types: 2 Cans of beer per week    FAMILY HISTORY: Mother and father deceased  DRUG ALLERGIES: No Known Allergies  REVIEW OF SYSTEMS:   CONSTITUTIONAL: No fever,has  fatigue and weakness.  EYES: No blurred or double vision.  EARS, NOSE, AND THROAT: No tinnitus or ear pain.  RESPIRATORY: No cough, shortness of breath, wheezing or hemoptysis.  CARDIOVASCULAR: No chest pain, orthopnea, edema.  GASTROINTESTINAL: No nausea, vomiting, diarrhea or abdominal pain.  GENITOURINARY: No dysuria, hematuria.   ENDOCRINE: No polyuria, nocturia,  HEMATOLOGY: No anemia, easy bruising or bleeding SKIN: No rash or lesion. MUSCULOSKELETAL: Lumbar spine spondylosis NEUROLOGIC: No tingling, numbness, weakness.  PSYCHIATRY: No anxiety or depression.   MEDICATIONS AT HOME:  Prior to Admission medications   Not on File      PHYSICAL EXAMINATION:   VITAL SIGNS: Blood pressure 111/81, pulse (!) 102, temperature (!) 97.5 F (36.4 C), temperature source Oral, resp. rate 20, height 4\' 6"  (1.372 m), weight 37.2 kg, SpO2 95 %.  GENERAL:  73 y.o.-year-old patient lying in the bed with no acute distress.  EYES: Pupils equal, round, reactive to light and accommodation. No scleral icterus. Extraocular muscles intact.  HEENT: Head atraumatic, normocephalic. Oropharynx and nasopharynx clear.  NECK:  Supple, no jugular venous distention. No thyroid enlargement, no tenderness.  LUNGS: Normal breath sounds bilaterally, scattered rales left lung. No use of accessory muscles of respiration.  CARDIOVASCULAR: S1, S2 normal. No murmurs, rubs, or gallops.  ABDOMEN: Soft, nontender, nondistended. Bowel sounds present. No organomegaly or mass.  EXTREMITIES: No pedal edema, cyanosis, or clubbing.  Lumbar spine spondylosis NEUROLOGIC: Cranial nerves II through XII are intact. Muscle strength 5/5 in all extremities. Sensation intact. Gait not checked.  PSYCHIATRIC: The patient is alert and oriented x 3.  SKIN: No obvious rash, lesion, or ulcer.   LABORATORY PANEL:   CBC Recent Labs  Lab 03/08/2019 1638  WBC 13.2*  HGB 13.6  HCT 37.6  PLT 349  MCV 89.7  MCH 32.5  MCHC 36.2*  RDW 11.8  LYMPHSABS 1.1  MONOABS 1.8*  EOSABS 0.1  BASOSABS 0.1   ------------------------------------------------------------------------------------------------------------------  Chemistries  Recent Labs  Lab 03/01/2019 1638  NA 112*  K 4.2  CL 75*  CO2 23  GLUCOSE 105*  BUN 6*  CREATININE <0.30*  CALCIUM 8.6*  AST 26  ALT  13  ALKPHOS 115  BILITOT 1.4*   ------------------------------------------------------------------------------------------------------------------ CrCl cannot be calculated (This lab value cannot be used to calculate CrCl because it is not a number: <0.30). ------------------------------------------------------------------------------------------------------------------ No results for input(s): TSH, T4TOTAL, T3FREE, THYROIDAB in the last 72 hours.  Invalid input(s): FREET3   Coagulation profile No results for input(s): INR, PROTIME in the last 168 hours. ------------------------------------------------------------------------------------------------------------------- No results for input(s): DDIMER in the last 72 hours. -------------------------------------------------------------------------------------------------------------------  Cardiac Enzymes No results for input(s): CKMB, TROPONINI, MYOGLOBIN in the last 168 hours.  Invalid input(s): CK ------------------------------------------------------------------------------------------------------------------ Invalid input(s): POCBNP  ---------------------------------------------------------------------------------------------------------------  Urinalysis No results found for: COLORURINE, APPEARANCEUR, LABSPEC, PHURINE, GLUCOSEU, HGBUR, BILIRUBINUR, KETONESUR, PROTEINUR, UROBILINOGEN, NITRITE, LEUKOCYTESUR   RADIOLOGY: Dg Chest 2 View  Result Date: 02/20/2019 CLINICAL DATA:  Back pain 1 week.  No injury.  Difficulty breathing. EXAM: CHEST - 2 VIEW COMPARISON:  Chest CT 11/12/2018 FINDINGS: Lungs are adequately inflated demonstrate bibasilar opacification with small right pleural effusion. Findings may be due to infection. There is cardiomegaly as well as mild prominence of the perihilar markings suggesting mild vascular congestion. Old left lower posterior rib fractures. Severe curvature of the thoracolumbar spine convex right with  degenerative changes. IMPRESSION: Bibasilar opacification which may be due to infection with small right pleural effusion. Possible component of mild vascular congestion. Cardiomegaly. Electronically Signed   By: Elberta Fortis M.D.   On: 03/03/2019 16:36   Dg Lumbar Spine Complete  Result Date: 02/18/2019 CLINICAL DATA:  Low back pain 1 week.  No injury. EXAM: LUMBAR SPINE - COMPLETE 4+ VIEW COMPARISON:  None. FINDINGS: There is severe curvature of the thoracolumbar spine convex right. It is difficult to evaluate the lateral films due to the significant curvature present. There is moderate spondylosis of the spine. No definite compression fracture or subluxation. Calcified T11-12 disc. IMPRESSION: No acute findings. 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 Electronically Signed   By: Elberta Fortis M.D.   On: 02/16/2019 16:29   Dg Hip Unilat W Or Wo Pelvis 2-3 Views Right  Result Date: 03/05/2019 CLINICAL DATA:  Right hip and back pain 1 week.  No injury. EXAM: DG HIP (WITH OR WITHOUT PELVIS) 2-3V RIGHT COMPARISON:  None. FINDINGS: Diffuse decreased bone mineralization is present. There mild symmetric degenerative changes of the hips. There is no acute fracture or dislocation. Moderate degenerative change over the lumbosacral spine. Calcified plaque over the iliac and femoral arteries. IMPRESSION: No acute findings. Electronically Signed   By: Elberta Fortis M.D.   On: 02/21/2019 16:25    EKG: No orders found for this or any previous visit.  IMPRESSION AND PLAN: 73 year old female patient with history of COPD, hypertension, tobacco abuse presented to emergency room with hip pain.  Imaging studies showed no fracture.  Work-up showed low sodium level of 112.  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 Management plans discussed with the patient, family and they are in agreement.  CODE STATUS: Full code Advance Directive Documentation     Most Recent Value  Type of Advance Directive  Living will  Pre-existing out of facility DNR order (yellow form or pink MOST form)  -  "MOST" Form in Place?  -      TOTAL CRITICAL CARE TIME TAKING CARE OF THIS PATIENT: 55 minutes.    Ihor AustinPavan Pyreddy M.D on 02/23/2019 at 6:59 PM  Between 7am to 6pm - Pager - [phone]  After 6pm go to [email or web address] - password [email or web address] [facility]  [facility]  Office  [phone]  CC: Primary care physician; [facility]

## 2019-02-18 ENCOUNTER — Inpatient Hospital Stay: Payer: Medicare Other

## 2019-02-18 LAB — SODIUM
Sodium: 113 mmol/L — CL (ref 135–145)
Sodium: 114 mmol/L — CL (ref 135–145)
Sodium: 114 mmol/L — CL (ref 135–145)
Sodium: 114 mmol/L — CL (ref 135–145)
Sodium: 114 mmol/L — CL (ref 135–145)
Sodium: 116 mmol/L — CL (ref 135–145)

## 2019-02-18 LAB — CBC
HCT: 36 % (ref 36.0–46.0)
Hemoglobin: 12.9 g/dL (ref 12.0–15.0)
MCH: 32.7 pg (ref 26.0–34.0)
MCHC: 35.8 g/dL (ref 30.0–36.0)
MCV: 91.1 fL (ref 80.0–100.0)
Platelets: 360 10*3/uL (ref 150–400)
RBC: 3.95 MIL/uL (ref 3.87–5.11)
RDW: 11.6 % (ref 11.5–15.5)
WBC: 13.9 10*3/uL — ABNORMAL HIGH (ref 4.0–10.5)
nRBC: 0 % (ref 0.0–0.2)

## 2019-02-18 LAB — GLUCOSE, CAPILLARY: Glucose-Capillary: 132 mg/dL — ABNORMAL HIGH (ref 70–99)

## 2019-02-18 LAB — TSH: TSH: 1.71 u[IU]/mL (ref 0.350–4.500)

## 2019-02-18 MED ORDER — IPRATROPIUM-ALBUTEROL 0.5-2.5 (3) MG/3ML IN SOLN
3.0000 mL | Freq: Four times a day (QID) | RESPIRATORY_TRACT | Status: DC | PRN
  Administered 2019-02-18 – 2019-02-19 (×2): 3 mL via RESPIRATORY_TRACT
  Filled 2019-02-18 (×3): qty 3

## 2019-02-18 MED ORDER — PROMETHAZINE HCL 25 MG/ML IJ SOLN
12.5000 mg | Freq: Four times a day (QID) | INTRAMUSCULAR | Status: DC | PRN
  Administered 2019-02-18: 19:00:00 12.5 mg via INTRAVENOUS
  Filled 2019-02-18: qty 1

## 2019-02-18 MED ORDER — SERTRALINE HCL 50 MG PO TABS
100.0000 mg | ORAL_TABLET | Freq: Every day | ORAL | Status: DC
  Administered 2019-02-18 – 2019-02-20 (×3): 100 mg via ORAL
  Filled 2019-02-18 (×3): qty 2

## 2019-02-18 MED ORDER — ONDANSETRON HCL 4 MG/2ML IJ SOLN
4.0000 mg | Freq: Once | INTRAMUSCULAR | Status: AC
  Administered 2019-02-18: 20:00:00 4 mg via INTRAVENOUS
  Filled 2019-02-18: qty 2

## 2019-02-18 MED ORDER — METOPROLOL TARTRATE 5 MG/5ML IV SOLN: 5.0000 mg | INTRAVENOUS | Status: DC | PRN

## 2019-02-18 MED ORDER — SODIUM CHLORIDE 3 % IV SOLN
INTRAVENOUS | Status: DC
  Administered 2019-02-18 – 2019-02-19 (×2): 25 mL/h via INTRAVENOUS
  Filled 2019-02-18 (×2): qty 500

## 2019-02-18 NOTE — Progress Notes (Addendum)
MD notified: Critical lab (sodium) 114. Nausea meds provided.

## 2019-02-18 NOTE — Consult Note (Addendum)
Pharmacy to monitor hypertonic saline:   Goal for acute hyponatremia: Increase Na by 4-6 mEq/L in 4-6 hours   3% NaCl rates < 100 mL/hr (current rate 67ml/hour)   Will Monitor Na q2h x 2 occurrences, then q4h   0407 1008 Na = 114 0407 1311 Na =114   Will Call RN to stop infusion and notify MD if   - Na increases by 4 mEq/L or more in the first 2 hours   OR   - Na increases by 6 mEq/L or more in the first 4 hours    Will notify MD for rate changes as appropriate   Albina Billet, PharmD, BCPS Clinical Pharmacist 02/18/2019 11:04 AM

## 2019-02-18 NOTE — Progress Notes (Signed)
PT Cancellation Note  Patient Details Name: BROOKLYNNE ZHAO MRN: [id number] DOB: 18-Nov-1945   Cancelled Treatment:    Reason Eval/Treat Not Completed: Medical issues which prohibited therapy(Sodium 114 critical low. Inappropriate for PT at this time. Will check and try again tomorrow. )   [name]. Ilsa Iha, PT, DPT 02/18/19, 2:58 PM

## 2019-02-18 NOTE — Consult Note (Signed)
[facility]  CONSULT NOTE    Date: 02/18/2019                  Patient Name:  Mariah Nixon  MRN: [id number]  DOB: 12-30-1945  Age / Sex: 73 y.o., female         PCP: [facility]                 Service Requesting Consult: Dr. Nemiah Commander                 Reason for Consult: Hyponatremia            History of Present Illness: Mariah Nixon is a 73 y.o. white female with COPD and hypertension, who was admitted to [facility] on 02/28/2019 for hyponatremia. Serum sodium of 112 on admission. NS infusion overnight with no improvement.   Found to have pneumonia  Patient states she was getting short of breath for several days.    Medications: Outpatient medications: Medications Prior to Admission  Medication Sig Dispense Refill Last Dose  . amLODipine (NORVASC) 10 MG tablet Take 10 mg by mouth daily.   02/27/2019 at 0800  . ATROVENT HFA 17 MCG/ACT inhaler Inhale 2 puffs into the lungs every 4 (four) hours as needed for wheezing.    03/08/2019 at 0800  . sertraline (ZOLOFT) 100 MG tablet Take 100 mg by mouth daily.   03/01/2019 at 0800  . VENTOLIN HFA 108 (90 Base) MCG/ACT inhaler Inhale 2 puffs into the lungs every 4 (four) hours as needed for wheezing or shortness of breath.   03/03/2019 at prn    Current medications: Current Facility-Administered Medications  Medication Dose Route Frequency Provider Last Rate Last Dose  . 0.9 %  sodium chloride infusion  250 mL Intravenous PRN Pyreddy, Vivien Rota, MD      . acetaminophen (TYLENOL) tablet 650 mg  650 mg Oral Q6H PRN Pyreddy, Vivien Rota, MD       Or  . acetaminophen (TYLENOL) suppository 650 mg  650 mg Rectal Q6H PRN Pyreddy, Vivien Rota, MD      . azithromycin (ZITHROMAX) 500 mg in sodium chloride 0.9 % 250 mL IVPB  500 mg Intravenous Q24H Pyreddy, Pavan, MD      . cefTRIAXone (ROCEPHIN) 1 g in sodium chloride 0.9 % 100 mL IVPB  1 g Intravenous Q24H Ihor Austin, MD   Stopped at 02/27/2019 2057  . enoxaparin (LOVENOX)  injection 30 mg  30 mg Subcutaneous Q24H Pyreddy, Pavan, MD      . ipratropium-albuterol (DUONEB) 0.5-2.5 (3) MG/3ML nebulizer solution 3 mL  3 mL Nebulization Q6H PRN Enid Baas, MD   3 mL at 02/18/19 1104  . nicotine (NICODERM CQ - dosed in mg/24 hours) patch 21 mg  21 mg Transdermal Daily Pyreddy, Vivien Rota, MD   21 mg at 02/18/19 0804  . ondansetron (ZOFRAN) tablet 4 mg  4 mg Oral Q6H PRN Ihor Austin, MD       Or  . ondansetron (ZOFRAN) injection 4 mg  4 mg Intravenous Q6H PRN Ihor Austin, MD   4 mg at 02/18/19 0852  . senna-docusate (Senokot-S) tablet 1 tablet  1 tablet Oral QHS PRN Ihor Austin, MD      . sertraline (ZOLOFT) tablet 100 mg  100 mg Oral Daily Enid Baas, MD   100 mg at 02/18/19 1104  . sodium chloride (hypertonic) 3 % solution   Intravenous Continuous Chason Mciver, MD 25 mL/hr at 02/18/19 1239    .  sodium chloride flush (NS) 0.9 % injection 3 mL  3 mL Intravenous Q12H Ihor Austin, MD   3 mL at 02/18/19 0807  . sodium chloride flush (NS) 0.9 % injection 3 mL  3 mL Intravenous PRN Ihor Austin, MD          Allergies: Allergies  Allergen Reactions  . Ibuprofen Hives      Past Medical History: Past Medical History:  Diagnosis Date  . Asthma   . COPD (chronic obstructive pulmonary disease) (HCC)   . Hypertension      Past Surgical History: Past Surgical History:  Procedure Laterality Date  . CESAREAN SECTION       Family History: History reviewed. No pertinent family history.   Social History: Social History   Socioeconomic History  . Marital status: Divorced    Spouse name: Not on file  . Number of children: Not on file  . Years of education: Not on file  . Highest education level: Not on file  Occupational History  . Not on file  Social Needs  . Financial resource strain: Not on file  . Food insecurity:    Worry: Not on file    Inability: Not on file  . Transportation needs:    Medical: Not on file     Non-medical: Not on file  Tobacco Use  . Smoking status: Current Every Day Smoker    Packs/day: 0.50    Years: 42.00    Pack years: 21.00    Types: Cigarettes  . Smokeless tobacco: Never Used  Substance and Sexual Activity  . Alcohol use: Yes    Alcohol/week: 2.0 standard drinks    Types: 2 Cans of beer per week  . Drug use: Never  . Sexual activity: Not on file  Lifestyle  . Physical activity:    Days per week: Not on file    Minutes per session: Not on file  . Stress: Not on file  Relationships  . Social connections:    Talks on phone: Not on file    Gets together: Not on file    Attends religious service: Not on file    Active member of club or organization: Not on file    Attends meetings of clubs or organizations: Not on file    Relationship status: Not on file  . Intimate partner violence:    Fear of current or ex partner: Not on file    Emotionally abused: Not on file    Physically abused: Not on file    Forced sexual activity: Not on file  Other Topics Concern  . Not on file  Social History Narrative  . Not on file     Review of Systems: Review of Systems  Constitutional: Positive for fever, malaise/fatigue and weight loss. Negative for chills and diaphoresis.  HENT: Negative.  Negative for congestion, ear discharge, ear pain, hearing loss, nosebleeds, sinus pain, sore throat and tinnitus.   Eyes: Negative.  Negative for blurred vision, double vision, photophobia, pain, discharge and redness.  Respiratory: Positive for cough, shortness of breath and wheezing. Negative for hemoptysis, sputum production and stridor.   Cardiovascular: Negative.  Negative for chest pain, palpitations, orthopnea, claudication, leg swelling and PND.  Gastrointestinal: Negative.  Negative for abdominal pain, blood in stool, constipation, diarrhea, heartburn, melena, nausea and vomiting.  Genitourinary: Negative.  Negative for dysuria, flank pain, frequency, hematuria and urgency.   Musculoskeletal: Negative.  Negative for back pain, falls, joint pain, myalgias and neck pain.  Skin:  Negative.  Negative for itching and rash.  Neurological: Negative.  Negative for dizziness, tingling, tremors, sensory change, speech change, focal weakness, seizures, loss of consciousness, weakness and headaches.  Endo/Heme/Allergies: Negative.  Negative for environmental allergies and polydipsia. Does not bruise/bleed easily.  Psychiatric/Behavioral: Negative.  Negative for depression, hallucinations, memory loss, substance abuse and suicidal ideas. The patient is not nervous/anxious and does not have insomnia.     Vital Signs: Blood pressure 125/68, pulse 85, temperature 98 F (36.7 C), temperature source Oral, resp. rate 20, height 4\' 6"  (1.372 m), weight 37.2 kg, SpO2 93 %.  Weight trends: Filed Weights   02/25/2019 1541  Weight: 37.2 kg    Physical Exam: General: NAD,   Head: Normocephalic, atraumatic. Moist oral mucosal membranes  Eyes: Anicteric, PERRL  Neck: Supple, trachea midline  Lungs:  Clear to auscultation  Heart: Regular rate and rhythm  Abdomen:  Soft, nontender,   Extremities: no peripheral edema.  Neurologic: Nonfocal, moving all four extremities  Skin: No lesions         Lab results: Basic Metabolic Panel: Recent Labs  Lab 02/21/2019 1638  02/18/19 0348 02/18/19 0659 02/18/19 1008  NA 112*   < > 114* 114* 114*  K 4.2  --   --   --   --   CL 75*  --   --   --   --   CO2 23  --   --   --   --   GLUCOSE 105*  --   --   --   --   BUN 6*  --   --   --   --   CREATININE <0.30*  --   --   --   --   CALCIUM 8.6*  --   --   --   --    < > = values in this interval not displayed.    Liver Function Tests: Recent Labs  Lab 03/01/2019 1638  AST 26  ALT 13  ALKPHOS 115  BILITOT 1.4*  PROT 7.0  ALBUMIN 3.2*   No results for input(s): LIPASE, AMYLASE in the last 168 hours. No results for input(s): AMMONIA in the last 168 hours.  CBC: Recent Labs   Lab 02/27/2019 1638 02/18/19 0659  WBC 13.2* 13.9*  NEUTROABS 10.1*  --   HGB 13.6 12.9  HCT 37.6 36.0  MCV 89.7 91.1  PLT 349 360    Cardiac Enzymes: No results for input(s): CKTOTAL, CKMB, CKMBINDEX, TROPONINI in the last 168 hours.  BNP: Invalid input(s): POCBNP  CBG: No results for input(s): GLUCAP in the last 168 hours.  Microbiology: No results found for this or any previous visit.  Coagulation Studies: No results for input(s): LABPROT, INR in the last 72 hours.  Urinalysis: No results for input(s): COLORURINE, LABSPEC, PHURINE, GLUCOSEU, HGBUR, BILIRUBINUR, KETONESUR, PROTEINUR, UROBILINOGEN, NITRITE, LEUKOCYTESUR in the last 72 hours.  Invalid input(s): APPERANCEUR    Imaging: Dg Chest 2 View  Result Date: 03/06/2019 CLINICAL DATA:  Back pain 1 week.  No injury.  Difficulty breathing. EXAM: CHEST - 2 VIEW COMPARISON:  Chest CT 11/12/2018 FINDINGS: Lungs are adequately inflated demonstrate bibasilar opacification with small right pleural effusion. Findings may be due to infection. There is cardiomegaly as well as mild prominence of the perihilar markings suggesting mild vascular congestion. Old left lower posterior rib fractures. Severe curvature of the thoracolumbar spine convex right with degenerative changes. IMPRESSION: Bibasilar opacification which may be due to infection with small right  pleural effusion. Possible component of mild vascular congestion. Cardiomegaly. Electronically Signed   By: Elberta Fortis M.D.   On: 03-18-2019 16:36   Dg Lumbar Spine Complete  Result Date: Mar 18, 2019 CLINICAL DATA:  Low back pain 1 week.  No injury. EXAM: LUMBAR SPINE - COMPLETE 4+ VIEW COMPARISON:  None. FINDINGS: There is severe curvature of the thoracolumbar spine convex right. It is difficult to evaluate the lateral films due to the significant curvature present. There is moderate spondylosis of the spine. No definite compression fracture or subluxation. Calcified T11-12 disc.  IMPRESSION: No acute findings. 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 Electronically Signed   By: Elberta Fortis M.D.   On: Mar 18, 2019 16:29   Ct Chest Wo Contrast  Result Date: 02/18/2019 CLINICAL DATA:  Hyponatremia workup. Abnormal chest x-ray. No fever. EXAM: CT CHEST WITHOUT CONTRAST TECHNIQUE: Multidetector CT imaging of the chest was performed following the standard protocol without IV contrast. COMPARISON:  11/12/2018 screening chest CT FINDINGS: Cardiovascular: No cardiomegaly. There is a moderate low-density pericardial effusion. Extensive atherosclerotic calcification of the aorta and coronaries. Mediastinum/Nodes: Borderline paratracheal adenopathy which is presumably reactive in this setting. Lungs/Pleura: Volume loss and streaky opacity in the lower lungs, especially middle lobe and lingula. There is mild patchy consolidative and ground-glass opacity in the upper lungs. Small pleural effusions. Upper Abdomen: Distortion from scoliosis.  No acute finding Musculoskeletal: Severe dextroscoliosis centered at the thoracolumbar spine. No acute or aggressive finding. IMPRESSION: 1. Multifocal lung opacity with similar pattern to that seen by CT 11/12/2018. This may reflect recurrent pneumonia (including atypical pathogens) or chronic lung disease. There are trace pleural effusions. 2. Moderate low-density pericardial effusion. 3. Severe scoliosis. Electronically Signed   By: Marnee Spring M.D.   On: 02/18/2019 08:46   Dg Hip Unilat W Or Wo Pelvis 2-3 Views Right  Result Date: 03-18-2019 CLINICAL DATA:  Right hip and back pain 1 week.  No injury. EXAM: DG HIP (WITH OR WITHOUT PELVIS) 2-3V RIGHT COMPARISON:  None. FINDINGS: Diffuse decreased bone mineralization is present. There mild symmetric degenerative changes of the hips. There is no acute fracture or dislocation. Moderate degenerative change over the lumbosacral spine. Calcified plaque over the  iliac and femoral arteries. IMPRESSION: No acute findings. Electronically Signed   By: Elberta Fortis M.D.   On: 03-18-2019 16:25      Assessment & Plan: Mariah Nixon is a 73 y.o. white female with COPD and hypertension, who was admitted to [facility] on 03-18-2019 for hyponatremia. Serum sodium of 112 on admission. NS infusion overnight with no improvement. Continued on SSRI.  Seems to acute on chronic. Na from 05/2016 was 132.  - Start hypertonic saline infusion: 72mL/hr     LOS: 1 Henry Demeritt 4/7/20201:01 PM

## 2019-02-18 NOTE — Progress Notes (Signed)
MD notified about critical lab value (sodium 114) at 1008

## 2019-02-18 NOTE — Progress Notes (Signed)
MD notified: Critical sodium  lab reported to continue to be 114.  Zofran given for nausea again. The patient has not vomited, yet had nausea when eating.

## 2019-02-18 NOTE — Progress Notes (Signed)
MD notified: The patient indicates she still is nauseous even after she got zofran at 1343. Will she be able to have PRN phenergan if needed.

## 2019-02-18 NOTE — Progress Notes (Addendum)
MD notified: Oxygen 89-90% on room air. Patient indicated she felt as she had trouble breathing. 2L of oxygen provided. Current oxygen level 93% on 2L. She indicates She uses 2 inhalers at home  atrovent and albuterol at home. She indicated she uses atrovent every 4hrs at home. and albuterol PRN. I called the pharmacy tech for med reconciliation as there are no home meds recorded on chart.

## 2019-02-18 NOTE — Progress Notes (Signed)
[facility] [place] [facility]   PATIENT NAME: Mariah PriestMary Nixon    MR#:  [id number]  DATE OF BIRTH:  06/28/1946  SUBJECTIVE:   - admitted for hyponatremia, sodium still at 114 on normal saline  Patient report episode of nausea without vomiting. She state that she is having difficulty breathing with non-productive cough.      She has also not been drinking much fluids at home  REVIEW OF SYSTEMS:   Constitutional: Negative for chills, fever, malaise/fatigue and weight loss.  HENT: Negative for ear discharge, ear pain, hearing loss, nosebleeds and tinnitus.   Eyes: Negative for blurred vision, double vision and photophobia.  Respiratory: Positive for cough, shortness of breath and wheezing. Negative for hemoptysis  Cardiovascular: Negative for chest pain, palpitations, orthopnea and leg swelling.  Gastrointestinal: Negative for constipation, heartburn, melena,diarrhea, abdominal pain or vomiting. Positive for nausea Genitourinary: Negative for dysuria, frequency, hematuria and urgency.  Musculoskeletal: Negative for back pain, myalgias and neck pain.  Skin: Negative for rash.  Neurological: Negative for dizziness, tingling, tremors, sensory change, speech change, focal weakness, seizures and headaches.  Endo/Heme/Allergies: Does not bruise/bleed easily.  Psychiatric/Behavioral: Negative for depression.   DRUG ALLERGIES:   Allergies  Allergen Reactions   Ibuprofen Hives    VITALS:  Blood pressure 138/77, pulse 63, temperature 98 F (36.7 C), temperature source Oral, resp. rate 20, height 4\' 6"  (1.372 m), weight 37.2 kg, SpO2 93 %.  PHYSICAL EXAMINATION:  Physical Exam  GENERAL:  73 y.o.-year-old patient lying in the bed with no acute distress.  EYES: Pupils equal, round, reactive to light and accommodation. No scleral icterus. Extraocular muscles intact.  HEENT: Head atraumatic, normocephalic. Oropharynx and nasopharynx clear.  NECK:  Supple, no jugular  venous distention. No thyroid enlargement, no tenderness.  LUNGS: Normal breath sounds bilaterally, mild wheezing, No rales,rhonchi or crepitation. Minimal use of accessory muscles of respiration.  CARDIOVASCULAR: S1, S2 normal. No murmurs, rubs, or gallops.  ABDOMEN: Soft, nontender, nondistended. Bowel sounds present. No organomegaly or mass.  EXTREMITIES: Mild pedal and ankle edema, No cyanosis, or clubbing.  NEUROLOGIC: Cranial nerves II through XII are intact. Muscle strength 5/5 in all extremities. Sensation intact. Gait not checked.  PSYCHIATRIC: The patient is alert and oriented x 3.  SKIN: No obvious rash, lesion, or ulcer.   LABORATORY PANEL:   CBC Recent Labs  Lab 02/18/19 0659  WBC 13.9*  HGB 12.9  HCT 36.0  PLT 360   ------------------------------------------------------------------------------------------------------------------  Chemistries  Recent Labs  Lab 2019-04-06 1638  02/18/19 0659  NA 112*   < > 114*  K 4.2  --   --   CL 75*  --   --   CO2 23  --   --   GLUCOSE 105*  --   --   BUN 6*  --   --   CREATININE <0.30*  --   --   CALCIUM 8.6*  --   --   AST 26  --   --   ALT 13  --   --   ALKPHOS 115  --   --   BILITOT 1.4*  --   --    < > = values in this interval not displayed.   ------------------------------------------------------------------------------------------------------------------  Cardiac Enzymes No results for input(s): TROPONINI in the last 168 hours. ------------------------------------------------------------------------------------------------------------------  RADIOLOGY:  Dg Chest 2 View  Result Date: 02/18/2019 CLINICAL DATA:  Back pain 1 week.  No injury.  Difficulty breathing. EXAM: CHEST -  2 VIEW COMPARISON:  Chest CT 11/12/2018 FINDINGS: Lungs are adequately inflated demonstrate bibasilar opacification with small right pleural effusion. Findings may be due to infection. There is cardiomegaly as well as mild prominence of the  perihilar markings suggesting mild vascular congestion. Old left lower posterior rib fractures. Severe curvature of the thoracolumbar spine convex right with degenerative changes. IMPRESSION: Bibasilar opacification which may be due to infection with small right pleural effusion. Possible component of mild vascular congestion. Cardiomegaly. Electronically Signed   By: Elberta Fortis M.D.   On: 02/15/2019 16:36   Dg Lumbar Spine Complete  Result Date: 02/19/2019 CLINICAL DATA:  Low back pain 1 week.  No injury. EXAM: LUMBAR SPINE - COMPLETE 4+ VIEW COMPARISON:  None. FINDINGS: There is severe curvature of the thoracolumbar spine convex right. It is difficult to evaluate the lateral films due to the significant curvature present. There is moderate spondylosis of the spine. No definite compression fracture or subluxation. Calcified T11-12 disc. IMPRESSION: No acute findings. 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 Electronically Signed   By: Elberta Fortis M.D.   On: 02/21/2019 16:29   Ct Chest Wo Contrast  Result Date: 02/18/2019 CLINICAL DATA:  Hyponatremia workup. Abnormal chest x-ray. No fever. EXAM: CT CHEST WITHOUT CONTRAST TECHNIQUE: Multidetector CT imaging of the chest was performed following the standard protocol without IV contrast. COMPARISON:  11/12/2018 screening chest CT FINDINGS: Cardiovascular: No cardiomegaly. There is a moderate low-density pericardial effusion. Extensive atherosclerotic calcification of the aorta and coronaries. Mediastinum/Nodes: Borderline paratracheal adenopathy which is presumably reactive in this setting. Lungs/Pleura: Volume loss and streaky opacity in the lower lungs, especially middle lobe and lingula. There is mild patchy consolidative and ground-glass opacity in the upper lungs. Small pleural effusions. Upper Abdomen: Distortion from scoliosis.  No acute finding Musculoskeletal: Severe dextroscoliosis centered at the  thoracolumbar spine. No acute or aggressive finding. IMPRESSION: 1. Multifocal lung opacity with similar pattern to that seen by CT 11/12/2018. This may reflect recurrent pneumonia (including atypical pathogens) or chronic lung disease. There are trace pleural effusions. 2. Moderate low-density pericardial effusion. 3. Severe scoliosis. Electronically Signed   By: Marnee Spring M.D.   On: 02/18/2019 08:46   Dg Hip Unilat W Or Wo Pelvis 2-3 Views Right  Result Date: 02/16/2019 CLINICAL DATA:  Right hip and back pain 1 week.  No injury. EXAM: DG HIP (WITH OR WITHOUT PELVIS) 2-3V RIGHT COMPARISON:  None. FINDINGS: Diffuse decreased bone mineralization is present. There mild symmetric degenerative changes of the hips. There is no acute fracture or dislocation. Moderate degenerative change over the lumbosacral spine. Calcified plaque over the iliac and femoral arteries. IMPRESSION: No acute findings. Electronically Signed   By: Elberta Fortis M.D.   On: 02/19/2019 16:25    EKG:  No orders found for this or any previous visit.  ASSESSMENT AND PLAN:   73 year old female patient with history of COPD, hypertension, tobacco abuse presented to emergency room with hip pain.  Imaging studies showed no fracture.  Work-up showed low sodium level of 112.  1. Acute hyponatremia - Hypovolemic likely. - Initial concerns for paraneoplastic syndrome, however cannot rule out other causes given hypo-osmolar state. - Check levels of TSH and cortisol to exclude any associated endocrinopathy - Chest CT showed multifocal lung opacity consistent with recurrent pneumonia with trace pleural effusion. No lung masses seen - received IV fluids with normal saline without much improvement- so started on 3% saline - Sodium levels today 114,  on 3% NaCl. Will Monitor Na q2h x 2 occurrences, then q4h. Pharmacy to monitor hypertonic saline dosing, at 25cc/hr today. - Nephrology consult appreciated  2. Community-acquired  pneumonia - Continue  IV Rocephin and Zithromax antibiotics -  chest CT findings reviewed  3.  chronic COPD with minimal Exacerbation- Patient c/o difficulty breathing, shortness of breath with notable use of  use of accessory muscles of respiration. Oxygen sats on room air 89-90% - Supplemental O2, goal sats>90% - Bronchodilators (albuterol/ipratropium) PRN, will hold off on systemic steroids at this time given lung infection - Will need Pulmonary rehabilitation referral at discharge if appropriate  4. Tobacco abuse - Counseled on smoking cessation, on nicotine patch  5. DVT Prophylaxis- Lovenox  Physical Therapy once sodium improves   All the records are reviewed and case discussed with Care Management/Social Workerr. Management plans discussed with the patient, family and they are in agreement.  CODE STATUS: Full Code  TOTAL TIME TAKING CARE OF THIS PATIENT: 36 minutes.   POSSIBLE D/C IN 2-3 DAYS, DEPENDING ON CLINICAL CONDITION.   Loraine Leriche M.D on 02/18/2019 at 10:12 AM  Between 7am to 6pm - Pager - [phone]  After 6pm go to [email or web address] - Social research officer, government  [facility]  Office  [phone]  CC: Primary care physician; [facility]

## 2019-02-18 NOTE — Plan of Care (Signed)
Sodium levels monitored every 4 hours. No falls.  Generalized weakness. Hypertonic solution ordered. CT of the chest ordered. 2L of oxygen provided for oxygen level of 90%. Nausea medication provided after the patient returned from a CT scan of the chest.  Problem: Education: Goal: Knowledge of General Education information will improve Description Including pain rating scale, medication(s)/side effects and non-pharmacologic comfort measures Outcome: Progressing   Problem: Health Behavior/Discharge Planning: Goal: Ability to manage health-related needs will improve Outcome: Progressing   Problem: Clinical Measurements: Goal: Ability to maintain clinical measurements within normal limits will improve Outcome: Progressing Goal: Will remain free from infection Outcome: Progressing Goal: Diagnostic test results will improve Outcome: Progressing Goal: Respiratory complications will improve Outcome: Progressing Goal: Cardiovascular complication will be avoided Outcome: Progressing   Problem: Activity: Goal: Risk for activity intolerance will decrease Outcome: Progressing   Problem: Nutrition: Goal: Adequate nutrition will be maintained Outcome: Progressing   Problem: Coping: Goal: Level of anxiety will decrease Outcome: Progressing   Problem: Elimination: Goal: Will not experience complications related to bowel motility Outcome: Progressing Goal: Will not experience complications related to urinary retention Outcome: Progressing   Problem: Pain Managment: Goal: General experience of comfort will improve Outcome: Progressing   Problem: Safety: Goal: Ability to remain free from injury will improve Outcome: Progressing   Problem: Skin Integrity: Goal: Risk for impaired skin integrity will decrease Outcome: Progressing

## 2019-02-19 ENCOUNTER — Inpatient Hospital Stay: Payer: Medicare Other

## 2019-02-19 DIAGNOSIS — E871 Hypo-osmolality and hyponatremia: Secondary | ICD-10-CM

## 2019-02-19 DIAGNOSIS — J9621 Acute and chronic respiratory failure with hypoxia: Secondary | ICD-10-CM

## 2019-02-19 DIAGNOSIS — J9622 Acute and chronic respiratory failure with hypercapnia: Secondary | ICD-10-CM

## 2019-02-19 LAB — BLOOD GAS, ARTERIAL
Acid-base deficit: 1.4 mmol/L (ref 0.0–2.0)
Bicarbonate: 26.9 mmol/L (ref 20.0–28.0)
FIO2: 1
O2 Saturation: 96.1 %
Patient temperature: 37
pCO2 arterial: 60 mmHg — ABNORMAL HIGH (ref 32.0–48.0)
pH, Arterial: 7.26 — ABNORMAL LOW (ref 7.350–7.450)
pO2, Arterial: 94 mmHg (ref 83.0–108.0)

## 2019-02-19 LAB — BASIC METABOLIC PANEL
Anion gap: 13 (ref 5–15)
BUN: 9 mg/dL (ref 8–23)
CO2: 25 mmol/L (ref 22–32)
Calcium: 8.5 mg/dL — ABNORMAL LOW (ref 8.9–10.3)
Chloride: 87 mmol/L — ABNORMAL LOW (ref 98–111)
Creatinine, Ser: 0.47 mg/dL (ref 0.44–1.00)
GFR calc Af Amer: >60 mL/min (ref >60)
GFR calc non Af Amer: >60 mL/min (ref >60)
Glucose, Bld: 164 mg/dL — ABNORMAL HIGH (ref 70–99)
Potassium: 4.3 mmol/L (ref 3.5–5.1)
Sodium: 125 mmol/L — ABNORMAL LOW (ref 135–145)

## 2019-02-19 LAB — SODIUM
Sodium: 121 mmol/L — ABNORMAL LOW (ref 135–145)
Sodium: 125 mmol/L — ABNORMAL LOW (ref 135–145)
Sodium: 127 mmol/L — ABNORMAL LOW (ref 135–145)
Sodium: 125 mmol/L — ABNORMAL LOW (ref 135–145)

## 2019-02-19 LAB — STREP PNEUMONIAE URINARY ANTIGEN: Strep Pneumo Urinary Antigen: NEGATIVE

## 2019-02-19 LAB — MRSA PCR SCREENING: MRSA by PCR: NEGATIVE

## 2019-02-19 LAB — GLUCOSE, CAPILLARY: Glucose-Capillary: 113 mg/dL — ABNORMAL HIGH (ref 70–99)

## 2019-02-19 LAB — CORTISOL-AM, BLOOD: Cortisol - AM: 31.6 ug/dL — ABNORMAL HIGH (ref 6.7–22.6)

## 2019-02-19 MED ORDER — METHYLPREDNISOLONE SODIUM SUCC 125 MG IJ SOLR
60.0000 mg | INTRAMUSCULAR | Status: AC
  Administered 2019-02-19: 13:00:00 60 mg via INTRAVENOUS
  Filled 2019-02-19: qty 2

## 2019-02-19 NOTE — Progress Notes (Signed)
Patient had taken off BNC at times during the night, which staff replaced. Before checking VS this morning BNC was off, oxygen levels in mid 70s only rising to 81%. Pt given duoneb tx which rose oxygen levels to mid 80s. Nurse placed pt on non-rebreather mask and contacted respiratory therapist who brought mask and Venti for breathing txs. Placed pt on BNC at 4 L , pt kept BNC in place , fell asleep. Oxygen sats stayed in mid 90s. Spoke with Dr Sheryle Hail and connected continuous pulse ox to pt's telemetry monitor. Nurse will slowly wean BNC level down d/t COPD hx.

## 2019-02-19 NOTE — Progress Notes (Signed)
PT Cancellation Note  Patient Details Name: Mariah Nixon MRN: [id number] DOB: November 19, 1945   Cancelled Treatment:    Reason Eval/Treat Not Completed: Medical issues which prohibited therapy(Patient experienced change in medical status and moved to higher level of care. Will need new PT order when she is appropriate for PT. )   [name]. Ilsa Iha, PT, DPT 02/19/19, 1:16 PM

## 2019-02-19 NOTE — Progress Notes (Signed)
[facility]  ROUNDING NOTE   Subjective:   Stopped hypertonic saline this morning.   Became short of breath and transferred to ICU.   Objective:  Vital signs in last 24 hours:  Temp:  [97.4 F (36.3 C)-98.3 F (36.8 C)] 98.3 F (36.8 C) (04/08 1204) Pulse Rate:  [82-120] 115 (04/08 1400) Resp:  [18-24] 19 (04/08 1400) BP: (113-133)/(66-90) 133/90 (04/08 1400) SpO2:  [81 %-100 %] 95 % (04/08 1400) FiO2 (%):  [75 %] 75 % (04/08 1221) Weight:  [39.6 kg] 39.6 kg (04/08 1204)  Weight change:  Filed Weights   03/11/2019 1541 02/19/19 1204  Weight: 37.2 kg 39.6 kg    Intake/Output: I/O last 3 completed shifts: In: 1680.9 [P.O.:45; I.V.:1281.7; IV Piggyback:354.3] Out: -    Intake/Output this shift:  No intake/output data recorded.  Physical Exam: General: NAD,   Head: Normocephalic, atraumatic. Moist oral mucosal membranes  Eyes: Anicteric, PERRL  Neck: Supple, trachea midline  Lungs:  Clear to auscultation  Heart: Regular rate and rhythm  Abdomen:  Soft, nontender,   Extremities:  no peripheral edema.  Neurologic: Nonfocal, moving all four extremities  Skin: No lesions        Basic Metabolic Panel: Recent Labs  Lab 02/15/2019 1638  02/18/19 1513 02/19/19 0106 02/19/19 0444 02/19/19 0858 02/19/19 1228  NA 112*   < > 116* 121* 125* 125* 127*  K 4.2  --   --   --  4.3  --   --   CL 75*  --   --   --  87*  --   --   CO2 23  --   --   --  25  --   --   GLUCOSE 105*  --   --   --  164*  --   --   BUN 6*  --   --   --  9  --   --   CREATININE <0.30*  --   --   --  0.47  --   --   CALCIUM 8.6*  --   --   --  8.5*  --   --    < > = values in this interval not displayed.    Liver Function Tests: Recent Labs  Lab 02/20/2019 1638  AST 26  ALT 13  ALKPHOS 115  BILITOT 1.4*  PROT 7.0  ALBUMIN 3.2*   No results for input(s): LIPASE, AMYLASE in the last 168 hours. No results for input(s): AMMONIA in the last 168 hours.  CBC: Recent Labs  Lab  02/28/2019 1638 02/18/19 0659  WBC 13.2* 13.9*  NEUTROABS 10.1*  --   HGB 13.6 12.9  HCT 37.6 36.0  MCV 89.7 91.1  PLT 349 360    Cardiac Enzymes: No results for input(s): CKTOTAL, CKMB, CKMBINDEX, TROPONINI in the last 168 hours.  BNP: Invalid input(s): POCBNP  CBG: Recent Labs  Lab 02/18/19 2006 02/19/19 1154  GLUCAP 132* 113*    Microbiology: Results for orders placed or performed during the hospital encounter of 02/28/2019  MRSA PCR Screening     Status: None   Collection Time: 02/19/19 12:27 PM  Result Value Ref Range Status   MRSA by PCR NEGATIVE NEGATIVE Final    Comment:        The GeneXpert MRSA Assay (FDA approved for NASAL specimens only), is one component of a comprehensive MRSA colonization surveillance program. It is not intended to diagnose MRSA infection nor to guide or  monitor treatment for MRSA infections. Performed at [facility], [address] [place], [place] [address]     Coagulation Studies: No results for input(s): LABPROT, INR in the last 72 hours.  Urinalysis: No results for input(s): COLORURINE, LABSPEC, PHURINE, GLUCOSEU, HGBUR, BILIRUBINUR, KETONESUR, PROTEINUR, UROBILINOGEN, NITRITE, LEUKOCYTESUR in the last 72 hours.  Invalid input(s): APPERANCEUR    Imaging: Dg Chest 1 View  Result Date: 02/19/2019 CLINICAL DATA:  Shortness of breath. History of COPD and hypertension. EXAM: CHEST  1 VIEW COMPARISON:  PA and lateral chest 03/11/2019. CT chest 11/12/2018 and 02/18/2019. FINDINGS: The patient has new pulmonary edema. Left pleural effusion and basilar airspace disease are also now identified. Small right pleural effusion appears decreased. There is cardiomegaly. Atherosclerosis is noted. Severe scoliosis is seen. IMPRESSION: New interstitial pulmonary edema. Increased left effusion and bibasilar airspace disease worrisome for pneumonia. Atherosclerosis. Electronically Signed   By: Drusilla Kanner M.D.   On: 02/19/2019 10:49    Dg Chest 2 View  Result Date: 03/04/2019 CLINICAL DATA:  Back pain 1 week.  No injury.  Difficulty breathing. EXAM: CHEST - 2 VIEW COMPARISON:  Chest CT 11/12/2018 FINDINGS: Lungs are adequately inflated demonstrate bibasilar opacification with small right pleural effusion. Findings may be due to infection. There is cardiomegaly as well as mild prominence of the perihilar markings suggesting mild vascular congestion. Old left lower posterior rib fractures. Severe curvature of the thoracolumbar spine convex right with degenerative changes. IMPRESSION: Bibasilar opacification which may be due to infection with small right pleural effusion. Possible component of mild vascular congestion. Cardiomegaly. Electronically Signed   By: Elberta Fortis M.D.   On: 02/28/2019 16:36   Dg Lumbar Spine Complete  Result Date: 02/20/2019 CLINICAL DATA:  Low back pain 1 week.  No injury. EXAM: LUMBAR SPINE - COMPLETE 4+ VIEW COMPARISON:  None. FINDINGS: There is severe curvature of the thoracolumbar spine convex right. It is difficult to evaluate the lateral films due to the significant curvature present. There is moderate spondylosis of the spine. No definite compression fracture or subluxation. Calcified T11-12 disc. IMPRESSION: No acute findings. 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 Electronically Signed   By: Elberta Fortis M.D.   On: 02/24/2019 16:29   Ct Chest Wo Contrast  Result Date: 02/18/2019 CLINICAL DATA:  Hyponatremia workup. Abnormal chest x-ray. No fever. EXAM: CT CHEST WITHOUT CONTRAST TECHNIQUE: Multidetector CT imaging of the chest was performed following the standard protocol without IV contrast. COMPARISON:  11/12/2018 screening chest CT FINDINGS: Cardiovascular: No cardiomegaly. There is a moderate low-density pericardial effusion. Extensive atherosclerotic calcification of the aorta and coronaries. Mediastinum/Nodes: Borderline paratracheal adenopathy  which is presumably reactive in this setting. Lungs/Pleura: Volume loss and streaky opacity in the lower lungs, especially middle lobe and lingula. There is mild patchy consolidative and ground-glass opacity in the upper lungs. Small pleural effusions. Upper Abdomen: Distortion from scoliosis.  No acute finding Musculoskeletal: Severe dextroscoliosis centered at the thoracolumbar spine. No acute or aggressive finding. IMPRESSION: 1. Multifocal lung opacity with similar pattern to that seen by CT 11/12/2018. This may reflect recurrent pneumonia (including atypical pathogens) or chronic lung disease. There are trace pleural effusions. 2. Moderate low-density pericardial effusion. 3. Severe scoliosis. Electronically Signed   By: Marnee Spring M.D.   On: 02/18/2019 08:46   Dg Hip Unilat W Or Wo Pelvis 2-3 Views Right  Result Date: 03/08/2019 CLINICAL DATA:  Right hip and back pain 1 week.  No injury. EXAM:  DG HIP (WITH OR WITHOUT PELVIS) 2-3V RIGHT COMPARISON:  None. FINDINGS: Diffuse decreased bone mineralization is present. There mild symmetric degenerative changes of the hips. There is no acute fracture or dislocation. Moderate degenerative change over the lumbosacral spine. Calcified plaque over the iliac and femoral arteries. IMPRESSION: No acute findings. Electronically Signed   By: Elberta Fortis M.D.   On: 03/12/2019 16:25     Medications:   . sodium chloride    . azithromycin Stopped (02/18/19 1524)  . cefTRIAXone (ROCEPHIN)  IV 1 g (02/18/19 1758)   . enoxaparin (LOVENOX) injection  30 mg Subcutaneous Q24H  . nicotine  21 mg Transdermal Daily  . sertraline  100 mg Oral Daily  . sodium chloride flush  3 mL Intravenous Q12H   sodium chloride, acetaminophen **OR** acetaminophen, ipratropium-albuterol, metoprolol tartrate, promethazine, senna-docusate, sodium chloride flush  Assessment/ Plan:  Mariah Nixon is a 73 y.o. white female with COPD and hypertension, who was admitted to [facility] on  03/02/2019 for hyponatremia. Serum sodium of 112 on admission. NS infusion overnight with no improvement. Continued on SSRI.   Seems to acute on chronic. Na from 05/2016 was 132.  - Discontinue hypertonic saline - Start fluid restriction    LOS: 2 Sims Laday 4/8/20202:49 PM

## 2019-02-19 NOTE — Consult Note (Addendum)
Reason for Consult: Assistance with management of respiratory failure.  Referring Physician: [name] is an 74 y.o. female.  HPI:  73 year old current smoker, with evidence of COPD by imaging and by clinical presentation, admitted on 6 April with hyponatremia.  Evaluation in the emergency room showed that she had findings consistent with pneumonia in the setting of severe COPD.  She had leukocytosis which she was admitted for management of the same.  Also notably she had significant hyponatremia.  She was having her electrolytes corrected when today she became more lethargic and arterial blood gases showed that she had CO2 retention.  She had been started on 3% saline and there was evidence of volume overload.  Sodium today is 127.  On arrival to the stepdown unit patient was on BiPAP, lethargic and poorly responsive.  No further history can be obtained.  Next of kin could not be reached.  Past Medical History:  Diagnosis Date  . Asthma   . COPD (chronic obstructive pulmonary disease) (HCC)   . Hypertension     Past Surgical History:  Procedure Laterality Date  . CESAREAN SECTION      History reviewed. No pertinent family history.  Social History:  reports that she has been smoking cigarettes. She has a 21.00 pack-year smoking history. She has never used smokeless tobacco. She reports current alcohol use of about 2.0 standard drinks of alcohol per week. She reports that she does not use drugs.  Allergies:  Allergies  Allergen Reactions  . Ibuprofen Hives    Medications:  I have reviewed the patient's current medications. Prior to Admission:  No medications prior to admission.    Results for orders placed or performed during the hospital encounter of 02-21-19 (from the past 48 hour(s))  Sodium     Status: Abnormal   Collection Time: 02/18/19 12:19 AM  Result Value Ref Range   Sodium 113 (LL) 135 - 145 mmol/L    Comment: CRITICAL RESULT CALLED TO, READ BACK BY  AND VERIFIED WITH STACEY CLAY AT 0049 02/18/2019 SMA Performed at [facility], [address] [place], [place] [address]   Sodium     Status: Abnormal   Collection Time: 02/18/19  3:48 AM  Result Value Ref Range   Sodium 114 (LL) 135 - 145 mmol/L    Comment: CRITICAL RESULT CALLED TO, READ BACK BY AND VERIFIED WITH STACEY CLAY AT 0439 02/18/2019 SMA Performed at [facility], [address] [place], [place] [address]   Sodium     Status: Abnormal   Collection Time: 02/18/19  6:59 AM  Result Value Ref Range   Sodium 114 (LL) 135 - 145 mmol/L    Comment: CRITICAL RESULT CALLED TO, READ BACK BY AND VERIFIED WITH JORGE MORALES  02/18/2019 MU/HKP Performed at [facility], [address] [place], [place] [address]   CBC     Status: Abnormal   Collection Time: 02/18/19  6:59 AM  Result Value Ref Range   WBC 13.9 (H) 4.0 - 10.5 K/uL   RBC 3.95 3.87 - 5.11 MIL/uL   Hemoglobin 12.9 12.0 - 15.0 g/dL   HCT 91.4 78.2 - 95.6 %   MCV 91.1 80.0 - 100.0 fL   MCH 32.7 26.0 - 34.0 pg   MCHC 35.8 30.0 - 36.0 g/dL   RDW 21.3 08.6 - 57.8 %   Platelets 360 150 - 400 K/uL   nRBC 0.0 0.0 - 0.2 %    Comment: Performed at  [facility], [address] [place], [place] [address]  Sodium     Status: Abnormal   Collection Time: 02/18/19 10:08 AM  Result Value Ref Range   Sodium 114 (LL) 135 - 145 mmol/L    Comment: CRITICAL RESULT CALLED TO, READ BACK BY AND VERIFIED WITH JOSH SIMSER @1029  02/18/2019 MU/SDR Performed at [facility], [address] [place], [place] [address]   Sodium     Status: Abnormal   Collection Time: 02/18/19  1:11 PM  Result Value Ref Range   Sodium 114 (LL) 135 - 145 mmol/L    Comment: CRITICAL RESULT CALLED TO, READ BACK BY AND VERIFIED WITH  JORGE MORALES AT 1340 02/18/19 SDR Performed at [facility], [address] [place], [place] [address]   Sodium     Status: Abnormal   Collection Time: 02/18/19   3:13 PM  Result Value Ref Range   Sodium 116 (LL) 135 - 145 mmol/L    Comment: CRITICAL RESULT CALLED TO, READ BACK BY AND VERIFIED WITH JORGE MORALES @ 1635 ON 02/18/19 BY JUW Performed at [facility], [address] [place], [place] [address]   TSH     Status: None   Collection Time: 02/18/19  3:13 PM  Result Value Ref Range   TSH 1.710 0.350 - 4.500 uIU/mL    Comment: Performed by a 3rd Generation assay with a functional sensitivity of <=0.01 uIU/mL. Performed at [facility], [address] [place], [place] [address]   Glucose, capillary     Status: Abnormal   Collection Time: 02/18/19  8:06 PM  Result Value Ref Range   Glucose-Capillary 132 (H) 70 - 99 mg/dL  Sodium     Status: Abnormal   Collection Time: 02/19/19  1:06 AM  Result Value Ref Range   Sodium 121 (L) 135 - 145 mmol/L    Comment: Performed at [facility], [address] [place], [place] [address]  Cortisol-am, blood     Status: Abnormal   Collection Time: 02/19/19  4:44 AM  Result Value Ref Range   Cortisol - AM 31.6 (H) 6.7 - 22.6 ug/dL    Comment: Performed at [facility], [address] [place], [place] [address]  Basic metabolic panel     Status: Abnormal   Collection Time: 02/19/19  4:44 AM  Result Value Ref Range   Sodium 125 (L) 135 - 145 mmol/L   Potassium 4.3 3.5 - 5.1 mmol/L   Chloride 87 (L) 98 - 111 mmol/L   CO2 25 22 - 32 mmol/L   Glucose, Bld 164 (H) 70 - 99 mg/dL   BUN 9 8 - 23 mg/dL   Creatinine, Ser 2.440.47 0.44 - 1.00 mg/dL   Calcium 8.5 (L) 8.9 - 10.3 mg/dL   GFR calc non Af Amer >60 >60 mL/min   GFR calc Af Amer >60 >60 mL/min   Anion gap 13 5 - 15    Comment: Performed at [facility], [address] [place], [place] [address]  Sodium     Status: Abnormal   Collection Time: 02/19/19  8:58 AM  Result Value Ref Range   Sodium 125 (L) 135 - 145 mmol/L    Comment: Performed at [facility], [address] [place], [place]  [address]  Blood gas, arterial     Status: Abnormal   Collection Time: 02/19/19 10:27 AM  Result Value Ref Range   FIO2 1.00    Delivery  systems NON-REBREATHER OXYGEN MASK    pH, Arterial 7.26 (L) 7.350 - 7.450   pCO2 arterial 60 (H) 32.0 - 48.0 mmHg   pO2, Arterial 94 83.0 - 108.0 mmHg   Bicarbonate 26.9 20.0 - 28.0 mmol/L   Acid-base deficit 1.4 0.0 - 2.0 mmol/L   O2 Saturation 96.1 %   Patient temperature 37.0    Collection site RIGHT RADIAL    Sample type ARTERIAL DRAW    Allens test (pass/fail) PASS PASS    Comment: Performed at [facility], [address] [place], [place] [address]  Glucose, capillary     Status: Abnormal   Collection Time: 02/19/19 11:54 AM  Result Value Ref Range   Glucose-Capillary 113 (H) 70 - 99 mg/dL  MRSA PCR Screening     Status: None   Collection Time: 02/19/19 12:27 PM  Result Value Ref Range   MRSA by PCR NEGATIVE NEGATIVE    Comment:        The GeneXpert MRSA Assay (FDA approved for NASAL specimens only), is one component of a comprehensive MRSA colonization surveillance program. It is not intended to diagnose MRSA infection nor to guide or monitor treatment for MRSA infections. Performed at [facility], [address] [place], [place] [address]   Sodium     Status: Abnormal   Collection Time: 02/19/19 12:28 PM  Result Value Ref Range   Sodium 127 (L) 135 - 145 mmol/L    Comment: Performed at [facility], [address] [place], [place] [address]  Sodium     Status: Abnormal   Collection Time: 02/19/19  3:43 PM  Result Value Ref Range   Sodium 125 (L) 135 - 145 mmol/L    Comment: Performed at [facility], [address] [place], [place] [address]  Strep pneumoniae urinary antigen     Status: None   Collection Time: 02/19/19  6:39 PM  Result Value Ref Range   Strep Pneumo Urinary Antigen NEGATIVE NEGATIVE    Comment: Performed at [facility], [address] [place], [place]  [address]    Dg Chest 1 View  Result Date: 02/19/2019 CLINICAL DATA:  Shortness of breath. History of COPD and hypertension. EXAM: CHEST  1 VIEW COMPARISON:  PA and lateral chest 03/07/2019. CT chest 11/12/2018 and 02/18/2019. FINDINGS: The patient has new pulmonary edema. Left pleural effusion and basilar airspace disease are also now identified. Small right pleural effusion appears decreased. There is cardiomegaly. Atherosclerosis is noted. Severe scoliosis is seen. IMPRESSION: New interstitial pulmonary edema. Increased left effusion and bibasilar airspace disease worrisome for pneumonia. Atherosclerosis. Electronically Signed   By: Drusilla Kanner M.D.   On: 02/19/2019 10:49   Ct Chest Wo Contrast  Result Date: 02/18/2019 CLINICAL DATA:  Hyponatremia workup. Abnormal chest x-ray. No fever. EXAM: CT CHEST WITHOUT CONTRAST TECHNIQUE: Multidetector CT imaging of the chest was performed following the standard protocol without IV contrast. COMPARISON:  11/12/2018 screening chest CT FINDINGS: Cardiovascular: No cardiomegaly. There is a moderate low-density pericardial effusion. Extensive atherosclerotic calcification of the aorta and coronaries. Mediastinum/Nodes: Borderline paratracheal adenopathy which is presumably reactive in this setting. Lungs/Pleura: Volume loss and streaky opacity in the lower lungs, especially middle lobe and lingula. There is mild patchy consolidative and ground-glass opacity in the upper lungs. Small pleural effusions. Upper Abdomen: Distortion from scoliosis.  No acute finding Musculoskeletal: Severe dextroscoliosis centered at the thoracolumbar spine. No acute or aggressive finding. IMPRESSION: 1. Multifocal lung opacity with similar pattern  to that seen by CT 11/12/2018. This may reflect recurrent pneumonia (including atypical pathogens) or chronic lung disease. There are trace pleural effusions. 2. Moderate low-density pericardial effusion. 3. Severe scoliosis. Electronically Signed    By: Marnee Spring M.D.   On: 02/18/2019 08:46    Review of Systems  Unable to perform ROS: Mental status change   Blood pressure 115/80, pulse (!) 133, temperature (!) 97.5 F (36.4 C), temperature source Oral, resp. rate (!) 23, height 4\' 6"  (1.372 m), weight 39.6 kg, SpO2 (!) 84 %. Physical Exam  Nursing note and vitals reviewed. Constitutional: She appears lethargic. She appears cachectic.  Poorly responsive on BiPAP  HENT:  Head: Normocephalic and atraumatic.  Mouth/Throat: Abnormal dentition.  Bilateral temporal wasting, edentulous  Eyes: Pupils are equal, round, and reactive to light. Conjunctivae are normal.  Neck: Neck supple. No JVD present. No tracheal deviation present.  Cardiovascular: Regular rhythm, S1 normal, S2 normal and intact distal pulses. Tachycardia present.  Respiratory: Accessory muscle usage present. She is in respiratory distress. She has decreased breath sounds. She has no wheezes. She has rhonchi. She has no rales.  Diffusely rhoncorous with distant breath sounds  GI: Soft. She exhibits no distension. There is no abdominal tenderness. There is no rebound.  Musculoskeletal:     Comments: Decreased muscle mass. Significant kyphoscoliosis.  Lymphadenopathy:    She has no cervical adenopathy.  Neurological: She appears lethargic.  Skin: Skin is warm and dry. No cyanosis. Nails show no clubbing.  Poor turgor  Psychiatric:  Cannot assess due to lethargy.    Assessment/Plan: 1.  Acute on chronic hypoxic respiratory failure: Patient is currently on BiPAP and appears to be responding to the same.  Transition to oxygen via nasal cannula as tolerates.  Recommend keeping saturations between 89 to 92%.  2.  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  Check Legionella antigen as this can cause hyponatremia as well.  Recommendations per renal.  3.  Chronic obstructive pulmonary disease: Very advanced by clinical  impression and by chest x-ray.  Patient has not had pulmonary function testing previously (she shun's doctors).  She will placed on nebulization treatments every 4 hours.  Pulmonary toilet.  BiPAP as needed.  4.Pulmonary cachexia: supplemental protein shake.  This issue adds complexity to her management.  It also indicates end-stage COPD.  5. EOL : will get Palliative Care to see.  Have not been able to reach next of kin today.    Gailen Shelter, MD [facility]   02/19/2019, 11:02 PM

## 2019-02-19 NOTE — Progress Notes (Addendum)
[employer] - [place] at [facility]   PATIENT NAME: Mariah Nixon    MR#:  [id number]  DATE OF BIRTH:  10-19-1946  SUBJECTIVE:   - Initial presentation with right hip pain no fx on x-ray found to have hyponatremia with sodium of 114. Sodium slightly improved to 125 on hypertonic solution.  Patient c/o worsening shortness of breath.   REVIEW OF SYSTEMS:   Constitutional: Negative for chills, fever, malaise/fatigue and weight loss.  HENT: Negative for ear discharge, ear pain, hearing loss, nosebleeds and tinnitus.   Eyes: Negative for blurred vision, double vision and photophobia.  Respiratory: Positive for cough, shortness of breath and wheezing. Negative for hemoptysis  Cardiovascular: Negative for chest pain, palpitations, orthopnea and leg swelling.  Gastrointestinal: Negative for constipation, heartburn, melena,diarrhea, abdominal pain or vomiting. Positive for nausea Genitourinary: Negative for dysuria, frequency, hematuria and urgency.  Musculoskeletal: Negative for back pain, myalgias and neck pain.  Skin: Negative for rash.  Neurological: Negative for dizziness, tingling, tremors, sensory change, speech change, focal weakness, seizures and headaches.  Endo/Heme/Allergies: Does not bruise/bleed easily.  Psychiatric/Behavioral: Negative for depression.   DRUG ALLERGIES:   Allergies  Allergen Reactions   Ibuprofen Hives    VITALS:  Blood pressure 113/70, pulse (!) 120, temperature 97.8 F (36.6 C), temperature source Oral, resp. rate 18, height  (1.372 m), weight 37.2 kg, SpO2 97 %.  PHYSICAL EXAMINATION:  Physical Exam  GENERAL:  73 y.o.-year-old patient lying in the bed with no acute distress.  EYES: Pupils equal, round, reactive to light and accommodation. No scleral icterus. Extraocular muscles intact.  HEENT: Head atraumatic, normocephalic. Oropharynx and nasopharynx clear.  NECK:  Supple, no jugular venous distention. No thyroid  enlargement, no tenderness.  LUNGS: Abnormal breath sounds bilaterally, moderate wheezing, rales, and rhonchi. Scat breath sounds,  Moderate use of accessory muscles of respiration.  CARDIOVASCULAR: S1, S2 normal. No murmurs, rubs, or gallops.  ABDOMEN: Soft, nontender, nondistended. Bowel sounds present. No organomegaly or mass.  EXTREMITIES: Mild pedal and ankle edema, No cyanosis, or clubbing.  NEUROLOGIC: Cranial nerves II through XII are intact. Muscle strength 5/5 in all extremities. Sensation intact. Gait not checked.  PSYCHIATRIC: The patient is alert and oriented x 3.  SKIN: No obvious rash, lesion, or ulcer.   LABORATORY PANEL:   CBC Recent Labs  Lab 02/18/19 0659  WBC 13.9*  HGB 12.9  HCT 36.0  PLT 360   ------------------------------------------------------------------------------------------------------------------  Chemistries  Recent Labs  Lab Feb 18, 2019 1638  02/19/19 0444 02/19/19 0858  NA 112*   < > 125* 125*  K 4.2  --  4.3  --   CL 75*  --  87*  --   CO2 23  --  25  --   GLUCOSE 105*  --  164*  --   BUN 6*  --  9  --   CREATININE <0.30*  --  0.47  --   CALCIUM 8.6*  --  8.5*  --   AST 26  --   --   --   ALT 13  --   --   --   ALKPHOS 115  --   --   --   BILITOT 1.4*  --   --   --    < > = values in this interval not displayed.   ------------------------------------------------------------------------------------------------------------------  Cardiac Enzymes No results for input(s): TROPONINI in the last 168 hours. ------------------------------------------------------------------------------------------------------------------  RADIOLOGY:  Dg Chest 2 View  Result Date: February 18, 2019  CLINICAL DATA:  Back pain 1 week.  No injury.  Difficulty breathing. EXAM: CHEST - 2 VIEW COMPARISON:  Chest CT 11/12/2018 FINDINGS: Lungs are adequately inflated demonstrate bibasilar opacification with small right pleural effusion. Findings may be due to infection.  There is cardiomegaly as well as mild prominence of the perihilar markings suggesting mild vascular congestion. Old left lower posterior rib fractures. Severe curvature of the thoracolumbar spine convex right with degenerative changes. IMPRESSION: Bibasilar opacification which may be due to infection with small right pleural effusion. Possible component of mild vascular congestion. Cardiomegaly. Electronically Signed   By: Elberta Fortisaniel  Boyle M.D.   On: 03/10/2019 16:36   Dg Lumbar Spine Complete  Result Date: 02/19/2019 CLINICAL DATA:  Low back pain 1 week.  No injury. EXAM: LUMBAR SPINE - COMPLETE 4+ VIEW COMPARISON:  None. FINDINGS: There is severe curvature of the thoracolumbar spine convex right. It is difficult to evaluate the lateral films due to the significant curvature present. There is moderate spondylosis of the spine. No definite compression fracture or subluxation. Calcified T11-12 disc. IMPRESSION: No acute findings. 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 Electronically Signed   By: Elberta Fortisaniel  Boyle M.D.   On: 02/18/2019 16:29   Ct Chest Wo Contrast  Result Date: 02/18/2019 CLINICAL DATA:  Hyponatremia workup. Abnormal chest x-ray. No fever. EXAM: CT CHEST WITHOUT CONTRAST TECHNIQUE: Multidetector CT imaging of the chest was performed following the standard protocol without IV contrast. COMPARISON:  11/12/2018 screening chest CT FINDINGS: Cardiovascular: No cardiomegaly. There is a moderate low-density pericardial effusion. Extensive atherosclerotic calcification of the aorta and coronaries. Mediastinum/Nodes: Borderline paratracheal adenopathy which is presumably reactive in this setting. Lungs/Pleura: Volume loss and streaky opacity in the lower lungs, especially middle lobe and lingula. There is mild patchy consolidative and ground-glass opacity in the upper lungs. Small pleural effusions. Upper Abdomen: Distortion from scoliosis.  No acute finding  Musculoskeletal: Severe dextroscoliosis centered at the thoracolumbar spine. No acute or aggressive finding. IMPRESSION: 1. Multifocal lung opacity with similar pattern to that seen by CT 11/12/2018. This may reflect recurrent pneumonia (including atypical pathogens) or chronic lung disease. There are trace pleural effusions. 2. Moderate low-density pericardial effusion. 3. Severe scoliosis. Electronically Signed   By: Marnee SpringJonathon  Watts M.D.   On: 02/18/2019 08:46   Dg Hip Unilat W Or Wo Pelvis 2-3 Views Right  Result Date: 02/28/2019 CLINICAL DATA:  Right hip and back pain 1 week.  No injury. EXAM: DG HIP (WITH OR WITHOUT PELVIS) 2-3V RIGHT COMPARISON:  None. FINDINGS: Diffuse decreased bone mineralization is present. There mild symmetric degenerative changes of the hips. There is no acute fracture or dislocation. Moderate degenerative change over the lumbosacral spine. Calcified plaque over the iliac and femoral arteries. IMPRESSION: No acute findings. Electronically Signed   By: Elberta Fortisaniel  Boyle M.D.   On: 03/10/2019 16:25    EKG:  No orders found for this or any previous visit.  ASSESSMENT AND PLAN:   73 year old female patient with history of COPD, hypertension, tobacco abuse presented to emergency room with hip pain.  Imaging studies showed no fracture.  Work-up showed low sodium level of 112.  1.  Acute hypoxic hypercapnia-likely secondary to COPD exacerbation and pneumonia.  Patient c/o difficulty breathing, shortness of breath with notable use of  use of accessory muscles of respiration with drop in oxygen saturation to the 80s. - Supplemental O2 with no improvement therefore placed on non-rebreather  - IV Solu-Medrol and bronchodilators with no  improvement - ABGs respiratory acidosis - STAT chest xray shows increased left effusion and bibasilar airspace disease  -  ICU consult - Transfer to step down unit for management  2. Acute hyponatremia - Hypovolemic likely. - Initial concerns for  paraneoplastic syndrome, however cannot rule out other causes given hypo-osmolar state. - TSH normal and cortisol slightly elevated - Chest CT showed multifocal lung opacity consistent with recurrent pneumonia with trace pleural effusion. No lung masses seen - received IV fluids with normal saline without much improvement- so was started on 3% saline yesterday with minimal improvement. - Sodium levels today 125, Hypertonic solution has been stopped due to worsening shortness of breath concerning for fluid overload. - Nephrology consult appreciated  3. Community-acquired pneumonia - Continue  IV Rocephin and Zithromax antibiotics - Chest CT findings reviewed - Repeat chest xray shows increased left effusion and bibasilar airspace disease   4. Tobacco abuse - Counseled on smoking cessation, on nicotine patch  5. DVT Prophylaxis- Lovenox  Physical Therapy once sodium improves   All the records are reviewed and case discussed with Care Management/Social Workerr. Management plans discussed with the patient, family and they are in agreement.  CODE STATUS: Full Code  TOTAL CRITICAL CARE TIME SPENT IN TAKING CARE OF THIS PATIENT: 45 minutes.   POSSIBLE D/C IN 2-3 DAYS, DEPENDING ON CLINICAL CONDITION.   Loraine Leriche M.D on 02/19/2019 at 10:29 AM  Between 7am to 6pm - Pager - [phone]  After 6pm go to [email or web address] - password [email or web address]  [facility]  Office  [phone]  CC: Primary care physician; [facility], [facility]     ADDENDUM:   Patient seen and examined, agree with detailed note above. - Patient presentation and plan discussed on rounds with APP.   - Patient was hypoxic this AM- ABG with hypoxia and hypercarbia, transfer to ICU for Bipap. Steroids, nebs, inh - continue rocephin and azithromycin for CAP - No fevers  - hyponatremia- was on 3% saline- held this AM as sodium improved to 125.  - Enid Baas

## 2019-02-19 NOTE — Progress Notes (Addendum)
At the beginning of the shift the patient had oxygen at 85% on 3L at 0928. Oxygen 88% on 4 L. Breathing treatment provided oxygen continues to be at 89-90%. 5L of nasal cannula provided and oxygen only improved to 90-91%. MD and NP were notified as they were on the floor at that time. A non-rebreather mask was placed. The patient's oxygen improved to 95-97%.  ABG and a portable chest x-ray were ordered. After results obtained it was determined to transfer the patient. The patient is oriented x2 at this time. The patient started to pull on his telemetry monitor and non-rebreather.

## 2019-02-20 ENCOUNTER — Inpatient Hospital Stay (HOSPITAL_COMMUNITY)
Admit: 2019-02-20 | Discharge: 2019-02-20 | Disposition: A | Discharge status: Home / Self Care | Payer: Medicare Other | Attending: Pulmonary Disease | Admitting: Pulmonary Disease

## 2019-02-20 DIAGNOSIS — Z515 Encounter for palliative care: Secondary | ICD-10-CM

## 2019-02-20 DIAGNOSIS — I340 Nonrheumatic mitral (valve) insufficiency: Secondary | ICD-10-CM

## 2019-02-20 DIAGNOSIS — I361 Nonrheumatic tricuspid (valve) insufficiency: Secondary | ICD-10-CM

## 2019-02-20 DIAGNOSIS — Z7189 Other specified counseling: Secondary | ICD-10-CM

## 2019-02-20 DIAGNOSIS — R0689 Other abnormalities of breathing: Secondary | ICD-10-CM

## 2019-02-20 LAB — BASIC METABOLIC PANEL
Anion gap: 13 (ref 5–15)
BUN: 16 mg/dL (ref 8–23)
CO2: 25 mmol/L (ref 22–32)
Calcium: 9.1 mg/dL (ref 8.9–10.3)
Chloride: 90 mmol/L — ABNORMAL LOW (ref 98–111)
Creatinine, Ser: 0.54 mg/dL (ref 0.44–1.00)
GFR calc Af Amer: >60 mL/min (ref >60)
GFR calc non Af Amer: >60 mL/min (ref >60)
Glucose, Bld: 126 mg/dL — ABNORMAL HIGH (ref 70–99)
Potassium: 5 mmol/L (ref 3.5–5.1)
Sodium: 128 mmol/L — ABNORMAL LOW (ref 135–145)

## 2019-02-20 LAB — CBC
HCT: 37.7 % (ref 36.0–46.0)
Hemoglobin: 12.6 g/dL (ref 12.0–15.0)
MCH: 33.1 pg (ref 26.0–34.0)
MCHC: 33.4 g/dL (ref 30.0–36.0)
MCV: 99 fL (ref 80.0–100.0)
Platelets: 374 10*3/uL (ref 150–400)
RBC: 3.81 MIL/uL — ABNORMAL LOW (ref 3.87–5.11)
RDW: 12.1 % (ref 11.5–15.5)
WBC: 16.9 10*3/uL — ABNORMAL HIGH (ref 4.0–10.5)
nRBC: 0 % (ref 0.0–0.2)

## 2019-02-20 LAB — GLUCOSE, CAPILLARY: Glucose-Capillary: 113 mg/dL — ABNORMAL HIGH (ref 70–99)

## 2019-02-20 LAB — ECHOCARDIOGRAM COMPLETE
Height: 54 in
Weight: 1396.83 oz

## 2019-02-20 LAB — SODIUM: Sodium: 126 mmol/L — ABNORMAL LOW (ref 135–145)

## 2019-02-20 MED ORDER — IPRATROPIUM BROMIDE 0.02 % IN SOLN
0.5000 mg | Freq: Four times a day (QID) | RESPIRATORY_TRACT | Status: DC | PRN
  Administered 2019-02-20: 0.5 mg via RESPIRATORY_TRACT
  Filled 2019-02-20: qty 2.5

## 2019-02-20 MED ORDER — ORAL CARE MOUTH RINSE
15.0000 mL | Freq: Two times a day (BID) | OROMUCOSAL | Status: DC
  Administered 2019-02-21 (×2): 15 mL via OROMUCOSAL

## 2019-02-20 MED ORDER — MORPHINE SULFATE (PF) 2 MG/ML IV SOLN
1.0000 mg | INTRAVENOUS | Status: DC | PRN
  Administered 2019-02-20 – 2019-02-21 (×2): 1 mg via INTRAVENOUS
  Filled 2019-02-20 (×2): qty 1

## 2019-02-20 MED ORDER — DEXTROSE-NACL 5-0.9 % IV SOLN
INTRAVENOUS | Status: DC
  Administered 2019-02-20: 18:00:00 100 mL via INTRAVENOUS
  Administered 2019-02-21: 05:00:00 via INTRAVENOUS

## 2019-02-20 MED ORDER — THIAMINE HCL 100 MG/ML IJ SOLN
100.0000 mg | Freq: Every day | INTRAMUSCULAR | Status: DC
  Administered 2019-02-20: 100 mg via INTRAVENOUS
  Filled 2019-02-20: qty 2

## 2019-02-20 MED ORDER — LEVALBUTEROL HCL 1.25 MG/0.5ML IN NEBU
1.2500 mg | INHALATION_SOLUTION | Freq: Four times a day (QID) | RESPIRATORY_TRACT | Status: DC | PRN
  Administered 2019-02-20: 1.25 mg via RESPIRATORY_TRACT
  Filled 2019-02-20: qty 0.5

## 2019-02-20 MED ORDER — FUROSEMIDE 10 MG/ML IJ SOLN
20.0000 mg | Freq: Once | INTRAMUSCULAR | Status: AC
  Administered 2019-02-20: 05:00:00 20 mg via INTRAVENOUS
  Filled 2019-02-20: qty 2

## 2019-02-20 MED ORDER — CHLORHEXIDINE GLUCONATE 0.12 % MT SOLN
15.0000 mL | Freq: Two times a day (BID) | OROMUCOSAL | Status: DC
  Administered 2019-02-20 – 2019-02-21 (×2): 15 mL via OROMUCOSAL
  Filled 2019-02-20: qty 15

## 2019-02-20 NOTE — Progress Notes (Signed)
Updated Ms. Anastacio's sister and son Dorene Sorrow this evening.  The mentioned that he was concerned about patient being confused while he talked to her over the phone. I explained how hypercapnia can cause some confusion, and lethargy and that is why we are putting her back on BIPAP to help lower her high CO2 levels. Dorene Sorrow expressed that he understands. He also mentioned that he would like the physician to hold any sedative medications at this time. Jeri Modena, NP made aware.

## 2019-02-20 NOTE — Consult Note (Signed)
Consultation Note Date: 02/20/2019   Patient Name: Mariah Nixon  DOB: Sep 30, 1946  MRN: [id number]  Age / Sex: 73 y.o., female  PCP: [facility] Referring Physician: Enid Baas, MD  Reason for Consultation: Establishing goals of care  HPI/Patient Profile: Mariah Nixon  is a 73 y.o. female with a known history of COPD, hypertension, tobacco abuse presented to the emergency room for hip pain.  Clinical Assessment and Goals of Care: Patient is resting in bed. Sitter at bedside. She has a NRB mask in place. She looks at this Clinical research associate but does not answer questions.   Spoke with sons who were together on speakerphone.They state they make decisions together as there is not a POA.  Mariah Nixon lives with her. Son states she was widowed 6 years ago, and began drinking beer. She drinks 6-7 beers per day, and has for the last 6 years, around the time she was widowed. She smokes as well.   At home, she does not use O2, or any assistive devices. He states she does yard work and has no functional limitations.    We discussed her diagnosis, prognosis, and GOC. The difference between an aggressive medical intervention path and a comfort care path was discussed.  Values and goals of care important to patient and family were attempted to be elicited.  The sons state she would rather be at home than at the hospital, but feel she would want to continue care as indicated to attempt to improve. Mariah Nixon states he feels if they can just get her home and stop her smoking and drinking, she will improve. We discussed her frailty.  He states she has had PNA before, and after a couple of days of abx she is better. We discussed the progressive nature of her COPD. The sons then state they have had 6 family members who have died at [facility], and they are requesting that if she is not improving, she be sent to [facility] "to give her  another chance."   Upon broaching GOC, they state they want to get her home before they discuss GOC as they want her to make the decisions. Discussed that we prefer to speak directly with patients, but due to her confusion, we are unable to assertain her wishes. We discussed planning and preparing for "what ifs". They state they want everything done that can be at this time until she can verbalize her wishes.     SUMMARY OF RECOMMENDATIONS   Recommend palliative at D/C.   Prognosis:   < 6 months   Discharge Planning: Home with Palliative Services      Primary Diagnoses: Present on Admission: . Hyponatremia   I have reviewed the medical record, interviewed the patient and family, and examined the patient. The following aspects are pertinent.  Past Medical History:  Diagnosis Date  . Asthma   . COPD (chronic obstructive pulmonary disease) (HCC)   . Hypertension    Social History   Socioeconomic History  . Marital status: Divorced  Spouse name: Not on file  . Number of children: Not on file  . Years of education: Not on file  . Highest education level: Not on file  Occupational History  . Not on file  Social Needs  . Financial resource strain: Not on file  . Food insecurity:    Worry: Not on file    Inability: Not on file  . Transportation needs:    Medical: Not on file    Non-medical: Not on file  Tobacco Use  . Smoking status: Current Every Day Smoker    Packs/day: 0.50    Years: 42.00    Pack years: 21.00    Types: Cigarettes  . Smokeless tobacco: Never Used  Substance and Sexual Activity  . Alcohol use: Yes    Alcohol/week: 2.0 standard drinks    Types: 2 Cans of beer per week  . Drug use: Never  . Sexual activity: Not on file  Lifestyle  . Physical activity:    Days per week: Not on file    Minutes per session: Not on file  . Stress: Not on file  Relationships  . Social connections:    Talks on phone: Not on file    Gets together: Not on file     Attends religious service: Not on file    Active member of club or organization: Not on file    Attends meetings of clubs or organizations: Not on file    Relationship status: Not on file  Other Topics Concern  . Not on file  Social History Narrative  . Not on file   History reviewed. No pertinent family history. Scheduled Meds: . enoxaparin (LOVENOX) injection  30 mg Subcutaneous Q24H  . nicotine  21 mg Transdermal Daily  . sertraline  100 mg Oral Daily  . sodium chloride flush  3 mL Intravenous Q12H  . thiamine injection  100 mg Intravenous Daily   Continuous Infusions: . sodium chloride    . azithromycin 500 mg (02/19/19 1837)  . cefTRIAXone (ROCEPHIN)  IV 1 g (02/19/19 1739)   PRN Meds:.sodium chloride, acetaminophen **OR** acetaminophen, ipratropium, levalbuterol, metoprolol tartrate, promethazine, senna-docusate, sodium chloride flush Medications Prior to Admission:  Prior to Admission medications   Medication Sig Start Date End Date Taking? Authorizing Provider  amLODipine (NORVASC) 10 MG tablet Take 10 mg by mouth daily. 12/24/18  Yes [provider]  ATROVENT HFA 17 MCG/ACT inhaler Inhale 2 puffs into the lungs every 4 (four) hours as needed for wheezing.  02/07/19  Yes [provider]  sertraline (ZOLOFT) 100 MG tablet Take 100 mg by mouth daily. 05/09/16  Yes [provider]  VENTOLIN HFA 108 (90 Base) MCG/ACT inhaler Inhale 2 puffs into the lungs every 4 (four) hours as needed for wheezing or shortness of breath. 01/30/19  Yes [provider]   Allergies  Allergen Reactions  . Ibuprofen Hives   Review of Systems  Unable to perform ROS   Physical Exam Pulmonary:     Comments: NRB in place.  Neurological:     Mental Status: She is alert.     Vital Signs: BP 124/66   Pulse (!) 117   Temp 98.2 F (36.8 C)   Resp (!) 22   Ht  (1.372 m)   Wt 39.6 kg   SpO2 (!) 84%   BMI 21.05 kg/m  Pain Scale: 0-10   Pain Score:  0-No pain   SpO2: SpO2: (!) 84 % O2 Device:SpO2: (!) 84 %  O2 Flow Rate: .O2 Flow Rate (L/min): 10 L/min  IO: Intake/output summary:   Intake/Output Summary (Last 24 hours) at 02/20/2019 1347 Last data filed at 02/20/2019 0800 Gross per 24 hour  Intake 100 ml  Output 450 ml  Net -350 ml    LBM: Last BM Date: 02/18/19 Baseline Weight: Weight: 37.2 kg Most recent weight: Weight: 39.6 kg     Palliative Assessment/Data:     Time In:12:00 Time Out: 1:10 Time Total: 70 min Greater than 50%  of this time was spent counseling and coordinating care related to the above assessment and plan.  Signed by: Morton Stallrystal Krystina Strieter, NP   Please contact Palliative Medicine Team phone at [phone] for questions and concerns.  For individual provider: See Loretha StaplerAmion

## 2019-02-20 NOTE — Progress Notes (Addendum)
0800 Patient found at foot of bed confused with oxygen off. Patient placed back in bed with bed alarm on. Oxygen saturations up and down on high flow oxygen. Patient pulling high flow oxygen off constantly so patient placed on BiPAP at 80%. Patient knows self but otherwise not oriented. In patients room about every 15 minutes to place back on BiPAP, patient yelling "I have had enough!!!" Patient pulling BiPAP apart and pulling off as well. Son called. Number given to Dr. Marcos Eke. Patient refused to eat breakfast.

## 2019-02-20 NOTE — Progress Notes (Signed)
Patient ID: Mariah Nixon, female   DOB: October 01, 1946, 73 y.o.   MRN: [id number]  Patient continues to be encephalopathic.  Review of systems cannot be obtained.  Not endorse dyspnea at present.  Intermittently requiring BiPAP.   Physical Exam  Nursing note and vitals reviewed. Constitutional: She is  lethargic. She is cachectic.  Poorly responsive on BiPAP  HENT: Bilateral temporal wasting, edentulous. Pupils are equal, round, and reactive to light.   Sclera nonicteric.  Neck: Neck supple. No JVD present. No tracheal deviation present.  Cardiovascular: Regular rhythm, S1 normal, S2 normal and intact distal pulses. Tachycardia present.  Respiratory: Accessory muscle usage present. Diffusely rhoncorous with distant breath sounds  GI: Soft. She exhibits no distension. There is no abdominal tenderness. There is no rebound.  Musculoskeletal: Decreased muscle mass, significant kyphoscoliosis.  Neurological: She is lethargic, cannot make any further assessment.  Does not appear to have focal deficits. Skin: Skin is warm and dry. No cyanosis.  Poor skin turgor  Psychiatric: Cannot assess due to lethargy.    Assessment/Plan: 1.  Acute on chronic hypoxic respiratory failure: Patient is currently on BiPAP she does not tolerate being on BiPAP for long periods of time starts pulling it off.   Does not tolerate oxygen via nasal cannula well.    Have discussed with the patient's son who is next of kin.  I have advised him that the patient is end-stage and will do poorly.  I recommend evaluation by palliative care and possible transition to hospice care.  Recommend that the patient be DNR/DNI.  2.  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  Additionaly, beer potomania a possibility, patient's son indicates that the patient drinks more than a sixpack a day.  Has been doing this since her husband's death more than a year ago.  3.  Chronic obstructive pulmonary  disease: Very advanced by clinical impression and by chest x-ray.  Patient has not had pulmonary function testing previously (she shun's doctors).    Continue bronchodilator  treatments every 4 hours.  Pulmonary toilet. BiPAP as needed.  4.Pulmonary cachexia: supplemental protein shake when able to take.  5. EOL : Discussed with Palliative Care. I discussed with patients son. Pts.prognosis is poor. Consider home with hospice care. Pt. Is DNR/DNI.  6. Alcohol abuse (son confirmed): Thiamine supplementation, supportive care.  Encephalopathy may be a combination of respiratory failure and alcohol withdrawal.  7. Severe kyphoscoliosis: this adds to her respiratory issues.

## 2019-02-20 NOTE — Progress Notes (Signed)
*  PRELIMINARY RESULTS* Echocardiogram 2D Echocardiogram has been performed.  Mariah Nixon 02/20/2019, 2:31 PM

## 2019-02-20 NOTE — Progress Notes (Signed)
[employer] - [place] at [facility]   PATIENT NAME: Mariah Nixon    MR#:  [id number]  DATE OF BIRTH:  February 27, 1946  SUBJECTIVE:  CHIEF COMPLAINT:   Chief Complaint  Patient presents with  . Hip Pain  Transferred to ICU on 02/19/19 for worsening respiratory status. Found to be in respiratory acidosis. Placed on Bipap.  Patient confused this morning and unable to follow commands.  REVIEW OF SYSTEMS:   Unable to assess due to altered mental status  DRUG ALLERGIES:   Allergies  Allergen Reactions  . Ibuprofen Hives    VITALS:  Blood pressure 124/66, pulse (!) 117, temperature 98.2 F (36.8 C), resp. rate (!) 22, height 4\' 6"  (1.372 m), weight 39.6 kg, SpO2 (!) 84 %.  PHYSICAL EXAMINATION:  GENERAL:  73 y.o.-year-old patient lying in the bed pale looking with sitter at the bedside. Appears restless EYES: Pupils equal, round, reactive to light and accommodation. No scleral icterus. Extraocular muscles intact.  HEENT: Head atraumatic, normocephalic. Oropharynx and nasopharynx clear.  NECK:  Supple, no jugular venous distention. No thyroid enlargement, no tenderness.  LUNGS: Abnormal breath sounds bilaterally, moderate wheezing, rales, and rhonchi. Scat breath sounds,  Moderate use of accessory muscles of respiration.  CARDIOVASCULAR: S1, S2 normal. No murmurs, rubs, or gallops.  ABDOMEN: Soft, nontender, nondistended. Bowel sounds present. No organomegaly or mass.  EXTREMITIES: Mild pedal and ankle edema, No cyanosis, or clubbing.  NEUROLOGIC: Cranial nerves II through XII are intact. Muscle strength 5/5 in all extremities. Sensation intact. Gait not checked. Confused, unable to follow commands PSYCHIATRIC: The patient is alert but disoriented  SKIN: No obvious rash, lesion, or ulcer.   LABORATORY PANEL:   CBC Recent Labs  Lab 02/20/19 0333  WBC 16.9*  HGB 12.6  HCT 37.7  PLT 374    ------------------------------------------------------------------------------------------------------------------  Chemistries  Recent Labs  Lab 03-04-19 1638  02/20/19 0333  NA 112*   < > 128*  K 4.2   < > 5.0  CL 75*   < > 90*  CO2 23   < > 25  GLUCOSE 105*   < > 126*  BUN 6*   < > 16  CREATININE <0.30*   < > 0.54  CALCIUM 8.6*   < > 9.1  AST 26  --   --   ALT 13  --   --   ALKPHOS 115  --   --   BILITOT 1.4*  --   --    < > = values in this interval not displayed.   ------------------------------------------------------------------------------------------------------------------  Cardiac Enzymes No results for input(s): TROPONINI in the last 168 hours. ------------------------------------------------------------------------------------------------------------------  RADIOLOGY:  Dg Chest 1 View  Result Date: 02/19/2019 CLINICAL DATA:  Shortness of breath. History of COPD and hypertension. EXAM: CHEST  1 VIEW COMPARISON:  PA and lateral chest 03/04/2019. CT chest 11/12/2018 and 02/18/2019. FINDINGS: The patient has new pulmonary edema. Left pleural effusion and basilar airspace disease are also now identified. Small right pleural effusion appears decreased. There is cardiomegaly. Atherosclerosis is noted. Severe scoliosis is seen. IMPRESSION: New interstitial pulmonary edema. Increased left effusion and bibasilar airspace disease worrisome for pneumonia. Atherosclerosis. Electronically Signed   By: Drusilla Kanner M.D.   On: 02/19/2019 10:49    EKG:  No orders found for this or any previous visit.  ASSESSMENT AND PLAN:   73 year old female patient with history of COPD, hypertension, tobacco abuse presented to emergency room with hip pain.Imaging studies showed no fracture.Work-up showed  low sodium level of 112.  1.  Acute hypoxic hypercapnia-likely secondary to COPD exacerbation and pneumonia. -Decompensated on the floor and transferred to ICU on 02/19/2019 for BiPAP.  -Received 1 dose of Solu-Medrol and Lasix on the day of transfer.  Continue to wean off BiPAP to nasal cannula as tolerated. -Patient high risk for intubation if does not improve, trying to reach family and also palliative consult placed - STAT chest xray showed increased left effusion and bibasilar airspace disease  - Management per ICU team  2. Acute delirium- delirious this morning.  Likely hospital-acquired delirium. -No focal neurological changes will hold off on ordering imaging of her head at this time -Avoid benzos - continue zoloft  3. Acute hyponatremia -  Combination of hypovolemia on adm and also SIADH - TSH normal and cortisol slightly elevated - Chest CT showed multifocal lung opacity consistent with recurrent pneumonia with trace pleural effusion. No lung masses seen - off IV fluids, received 3% saline briefly and has been off for >24 hrs, sodium at 128 today - If needed, can start fluid restriction - Nephrology consult appreciated  4. Community-acquired pneumonia - Continue  IV Rocephin and Zithromax antibiotics - Chest CT findings reviewed - Repeat chest xray showed increased left effusion and bibasilar airspace disease   5. Tobacco abuse - Counseled on smoking cessation, on nicotine patch  6. DVT Prophylaxis- Lovenox    All the records are reviewed and case discussed with Care Management/Social Workerr. Management plans discussed with the patient, family and they are in agreement.  CODE STATUS: Full Code  TOTAL TIME TAKING CARE OF THIS PATIENT: 38 minutes.   POSSIBLE D/C IN 2 DAYS, DEPENDING ON CLINICAL CONDITION.   Loraine LericheElizabeth A Ouma M.D on 02/20/2019 at 11:45 AM  Between 7am to 6pm - Pager - [phone]  After 6pm go to [email or web address] - Social research officer, governmentpassword EPAS [email or web address]  [facility] [place] [facility]  Office  [phone]  CC: Primary care physician; Center[facility]

## 2019-02-20 NOTE — Progress Notes (Signed)
Pt will not keep bipap on at this time, RN placed on 15L high flow cannula, pt comfortable, pulse ox unable to pick up at this time. No shortness of breath observed. Will continue to monitor.

## 2019-02-20 NOTE — TOC Initial Note (Signed)
Transition of Care [facility]) - Initial/Assessment Note    Patient Details  Name: Mariah Nixon MRN: [id number] Date of Birth: 05-Jul-1946  [facility] CM/SW Contact:    Allayne Butcher, RN Phone Number: 02/20/2019, 3:08 PM  Clinical Narrative:                 Patient is currently in the ICU admitted for hyponatremia but while on the floor experienced respiratory distress and is currently on a non rebreather.  Patient is lying in bed with eyes closed and is not responding to questions, sitter is at the bedside.  Patient is from home and lives with one of her sons, son reports that she is independent at home.  RNCM will cont to follow and assist with discharge needs.     Expected Discharge Plan: Home w Home Health Services Barriers to Discharge: Continued Medical Work up   Patient Goals and CMS Choice Patient states their goals for this hospitalization and ongoing recovery are:: Patient's son's voiced to palliative care that they would like for their mother to be at home and if she does not progress here at [facility] they want her transferred to [facility]      Expected Discharge Plan and Services Expected Discharge Plan: Home w Home Health Services       Living arrangements for the past 2 months: Single Family Home                          Prior Living Arrangements/Services Living arrangements for the past 2 months: Single Family Home Lives with:: Adult Children Patient language and need for interpreter reviewed:: Yes Do you feel safe going back to the place where you live?: Yes      Need for Family Participation in Patient Care: Yes (Comment)(Patient is chronically ill) Care giver support system in place?: Yes (comment)(lives with son)   Criminal Activity/Legal Involvement Pertinent to Current Situation/Hospitalization: No - Comment as needed  Activities of Daily Living Home Assistive Devices/Equipment: None ADL Screening (condition at time of admission) Patient's  cognitive ability adequate to safely complete daily activities?: Yes Is the patient deaf or have difficulty hearing?: No Does the patient have difficulty seeing, even when wearing glasses/contacts?: No Does the patient have difficulty concentrating, remembering, or making decisions?: No Patient able to express need for assistance with ADLs?: Yes Does the patient have difficulty dressing or bathing?: No Independently performs ADLs?: Yes (appropriate for developmental age) Does the patient have difficulty walking or climbing stairs?: Yes Weakness of Legs: Both Weakness of Arms/Hands: None  Permission Sought/Granted                  Emotional Assessment Appearance:: Appears older than stated age, Disheveled Attitude/Demeanor/Rapport: Unable to Assess(will not answer questions, lying in bed with eyes closed) Affect (typically observed): Restless   Alcohol / Substance Use: Tobacco Use, Alcohol Use Psych Involvement: No (comment)  Admission diagnosis:  Hyponatremia [E87.1] Right hip pain [M25.551] Patient Active Problem List   Diagnosis Date Noted  . Hyponatremia 02/21/2019   PCP:  Center, [facility] Pharmacy:   [facility] - [place], [place] - [address] [place] [place] [address] Phone: [phone] Fax: [phone]     Social Determinants of Health (SDOH) Interventions    Readmission Risk Interventions No flowsheet data found.

## 2019-02-21 ENCOUNTER — Inpatient Hospital Stay: Payer: Medicare Other

## 2019-02-21 DIAGNOSIS — R0602 Shortness of breath: Secondary | ICD-10-CM

## 2019-02-21 LAB — CBC
HCT: 37.5 % (ref 36.0–46.0)
Hemoglobin: 11.7 g/dL — ABNORMAL LOW (ref 12.0–15.0)
MCH: 32.1 pg (ref 26.0–34.0)
MCHC: 31.2 g/dL (ref 30.0–36.0)
MCV: 103 fL — ABNORMAL HIGH (ref 80.0–100.0)
Platelets: 372 10*3/uL (ref 150–400)
RBC: 3.64 MIL/uL — ABNORMAL LOW (ref 3.87–5.11)
RDW: 12.3 % (ref 11.5–15.5)
WBC: 33.6 10*3/uL — ABNORMAL HIGH (ref 4.0–10.5)
nRBC: 0 % (ref 0.0–0.2)

## 2019-02-21 LAB — BASIC METABOLIC PANEL
Anion gap: 9 (ref 5–15)
BUN: 28 mg/dL — ABNORMAL HIGH (ref 8–23)
CO2: 25 mmol/L (ref 22–32)
Calcium: 8.3 mg/dL — ABNORMAL LOW (ref 8.9–10.3)
Chloride: 96 mmol/L — ABNORMAL LOW (ref 98–111)
Creatinine, Ser: 1.06 mg/dL — ABNORMAL HIGH (ref 0.44–1.00)
GFR calc Af Amer: 60 mL/min — ABNORMAL LOW (ref >60)
GFR calc non Af Amer: 52 mL/min — ABNORMAL LOW (ref >60)
Glucose, Bld: 150 mg/dL — ABNORMAL HIGH (ref 70–99)
Potassium: 5.2 mmol/L — ABNORMAL HIGH (ref 3.5–5.1)
Sodium: 130 mmol/L — ABNORMAL LOW (ref 135–145)

## 2019-02-21 MED ORDER — LEVALBUTEROL HCL 1.25 MG/0.5ML IN NEBU: 0.6300 mg | INHALATION_SOLUTION | Freq: Four times a day (QID) | RESPIRATORY_TRACT | Status: DC | PRN

## 2019-02-21 NOTE — Progress Notes (Addendum)
[employer] - [place] at [facility]   PATIENT NAME: Mariah Nixon    MR#:  [id number]  DATE OF BIRTH:  05/01/1946  SUBJECTIVE:  CHIEF COMPLAINT:   Chief Complaint  Patient presents with  . Hip Pain   Patient transferred to the ICU on 02/19/19 for worsening respiratory status. Found to be in respiratory acidosis. Placed on BiPAP.  - new onset delirium  Patient still with intermittent confusion but able to follow commands. Remains on Bipap this morning  REVIEW OF SYSTEMS:   Unable to accurately assess due to fluctuating mental status  DRUG ALLERGIES:   Allergies  Allergen Reactions  . Ibuprofen Hives    VITALS:  Blood pressure 93/65, pulse (!) 109, temperature 98 F (36.7 C), temperature source Axillary, resp. rate 12, height 4\' 6"  (1.372 m), weight 39.6 kg, SpO2 (!) 87 %.  PHYSICAL EXAMINATION:  Physical Exam  GENERAL:  73 y.o.-year-old patient lying in the bed with no acute distress.  EYES: Pupils equal, round, reactive to light and accommodation. No scleral icterus. Extraocular muscles intact.  HEENT: Head atraumatic, normocephalic. Oropharynx and nasopharynx clear.  NECK:  Supple, no jugular venous distention. No thyroid enlargement, no tenderness.  LUNGS: scant breath sounds with occasional scattered wheezing. Abnormalbreath sounds bilaterally, lungs coarse bialterally. No use of accessory muscles of respiration.  CARDIOVASCULAR: S1, S2 normal. No murmurs, rubs, or gallops.  ABDOMEN: Soft, nontender, nondistended. Bowel sounds present. No organomegaly or mass.  EXTREMITIES: mild pedal and ankle edema, cyanosis, or clubbing.  NEUROLOGIC: Cranial nerves II through XII are intact. Muscle strength 5/5 in all extremities. Sensation intact. Gait not checked.  PSYCHIATRIC: The patient is alert and oriented x 3.  SKIN: No obvious rash, lesion, or ulcer.    LABORATORY PANEL:   CBC Recent Labs  Lab 02/21/19 0357  WBC 33.6*  HGB 11.7*  HCT 37.5  PLT  372   ------------------------------------------------------------------------------------------------------------------  Chemistries  Recent Labs  Lab 03/05/2019 1638  02/21/19 0357  NA 112*   < > 130*  K 4.2   < > 5.2*  CL 75*   < > 96*  CO2 23   < > 25  GLUCOSE 105*   < > 150*  BUN 6*   < > 28*  CREATININE <0.30*   < > 1.06*  CALCIUM 8.6*   < > 8.3*  AST 26  --   --   ALT 13  --   --   ALKPHOS 115  --   --   BILITOT 1.4*  --   --    < > = values in this interval not displayed.   ------------------------------------------------------------------------------------------------------------------  Cardiac Enzymes No results for input(s): TROPONINI in the last 168 hours. ------------------------------------------------------------------------------------------------------------------  RADIOLOGY:  Dg Chest Port 1 View  Result Date: 02/21/2019 CLINICAL DATA:  Respiratory failure. EXAM: PORTABLE CHEST 1 VIEW COMPARISON:  02/19/2019 FINDINGS: Marked radiographic worsening. There is now subtotal opacification of the left hemithorax consistent with accumulating pleural effusion and left lung atelectasis and or pneumonia as well as possibly edema. Worsened density in the right chest with some increase in pleural fluid, and diffuse pulmonary opacity which could be pneumonia or edema. IMPRESSION: Marked radiographic worsening. Large left effusion. Left lung atelectasis and or infiltrate. There could be pulmonary edema as well. Worsening on the right, with small amount of fluid and edema or. Electronically Signed   By: Paulina FusiMark  Shogry M.D.   On: 02/21/2019 10:28    EKG:  No orders found  for this or any previous visit.  ASSESSMENT AND PLAN:  73 year old female patient with history of COPD, hypertension, tobacco abuse presented to emergency room with hip pain.Imaging studies showed no fracture.Work-up showed low sodium level of 112.  1.Acute on chronic hypoxic respiratory failure:likely  secondary to COPD exacerbation and pneumonia.   -on BiPAP, still on 100% Fio2 this morning - wean as tolerated, management per ICU team.  -Patient high risk for intubation if does not improve, ICU team discussed with family goals of care and also palliative consult placed  2. Acute delirium-  Likely hospital-acquired delirium.  - also baseline alcohol abuse- ? Watch for withdrawals -No worsening symptoms reported thus far -No focal neurological changes will hold off on ordering imaging of her head at this time -continue zoloft  3. Acute hyponatremia -  Combination of hypovolemia on ad, SIADH associated with significant COPD and superimposed pneumonia. 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 - TSHnormaland cortisol slightly elevated - Chest CT showed multifocal lung opacity consistent with recurrent pneumonia with trace pleural effusion. No lung masses seen - off IV fluids, received 3% saline briefly and has been off now, -  sodium improved at 130 today - Nephrology consult appreciated  4. Community-acquired pneumonia with worsening leukocytosis-  - on IV Rocephin and Zithromax antibiotics - chest xray showing increased left effusion and bibasilar airspace disease- will need left pleural tap and may be broadening ABX if needed- will discuss with ICU attending  5. Tobacco abuse -  on nicotine patch  6. DVT Prophylaxis- Lovenox  EOL discussion with family, disposition home with palliative services Appreciate palliate consult Patient is currently a DNR  ADDENDUM: Discussed with Dr. Sung Amabile- patient is a DNR and family will be visiting today- she is critically ill. Poor prognosis Possible comfort care after family visit No plans to tap left pleural effusion.    All the records are reviewed and case discussed with Care Management/Social Workerr. Management plans discussed with the patient, family and they are in agreement.  CODE STATUS: DNR  TOTAL TIME TAKING CARE OF THIS  PATIENT: 37 minutes.   POSSIBLE D/C IN ? DAYS, DEPENDING ON CLINICAL CONDITION.   Loraine Leriche M.D on 02/21/2019 at 11:09 AM  Between 7am to 6pm - Pager - [phone]  After 6pm go to [email or web address] - Social research officer, government  [facility] [place] [facility]  Office  [phone]  CC: Primary care physician; Center, [facility]

## 2019-02-21 NOTE — Progress Notes (Signed)
Minimally responsive.  On BiPAP.  Extremely frail-appearing.  Not following commands.  Vitals:   02/21/19 1200 02/21/19 1300 02/21/19 1400 02/21/19 1500  BP: 108/85  105/68   Pulse: (!) 121 (!) 110 (!) 122 (!) 121  Resp: 13 15 15 13   Temp:   98 F (36.7 C)   TempSrc:   Axillary   SpO2: 91% (!) 76% (!) 79% (!) 81%  Weight:      Height:       BiPAP 100% FiO2  Appears much older than true age Cachectic Frail Somewhat labored on BiPAP Tachycardia, IR IR, no M Abdomen soft, diminished BS Extremities cool, no edema  BMP Latest Ref Rng & Units 02/21/2019 02/20/2019 02/20/2019  Glucose 70 - 99 mg/dL 671(I) - 458(K)  BUN 8 - 23 mg/dL 99(I) - 16  Creatinine 0.44 - 1.00 mg/dL 3.38(S) - 5.05  Sodium 135 - 145 mmol/L 130(L) 126(L) 128(L)  Potassium 3.5 - 5.1 mmol/L 5.2(H) - 5.0  Chloride 98 - 111 mmol/L 96(L) - 90(L)  CO2 22 - 32 mmol/L 25 - 25  Calcium 8.9 - 10.3 mg/dL 8.3(L) - 9.1   CBC Latest Ref Rng & Units 02/21/2019 02/20/2019 02/18/2019  WBC 4.0 - 10.5 K/uL 33.6(H) 16.9(H) 13.9(H)  Hemoglobin 12.0 - 15.0 g/dL 11.7(L) 12.6 12.9  Hematocrit 36.0 - 46.0 % 37.5 37.7 36.0  Platelets 150 - 400 K/uL 372 374 360    CXR: Marked radiographic worsening. Large left effusion. Left lung atelectasis and or infiltrate.  IMPRESSION: History of COPD with ongoing smoking History of ongoing alcohol abuse (5-7 beers per day) Severe protein-calorie malnutrition with cachexia Severe kyphoscoliosis Acute/chronic hypoxemic respiratory failure Very large L pleural effusion Hyponatremia, much improved AKI with doubling of creatinine over past 24 hours Very poor prognosis DNR/DNI  IMPRESSION/PLAN: Patient was made DNR/DNI by Dr. Jayme Cloud 4/9.  However, 1 of her sons is having difficulty accepting this decision.  I spoke at length in person and over the phone with patient's 2 sons, her sister and her brother.  They acknowledge that she is in poor health at baseline.  They understand the rationale for  DNR/DNI and I have encouraged that we proceed to comfort measures only.  However her son Dorene Sorrow, is having difficulty accepting this.  For now, we will continue BiPAP. DNR/DNI or stands.  I have asked hospital chaplain to come and meet with family.  Billy Fischer, MD PCCM service Mobile [phone] Pager [phone] 02/21/2019 4:54 PM

## 2019-02-21 NOTE — Progress Notes (Signed)
CH responded to pg regarding pt in [place]. Ch met w/ pt sons in the hallway before entering ICU to let them know that the ch was present for support. Son's were under a great deal of emotional distress and not yet in a place of acceptance. Ch provided words of comfort and gave time for the family to reflect on their time w/ the pt. The family was not prepared for the rapid decline of the pt but knew that she had several health issues that she was dealing with and a smoker. Ch encouraged family not to move towards any aggressive measures of treatment for the benefit of the pt's current condition. Ch handed care over to on call ch.       02/21/19 1515  Clinical Encounter Type  Visited With Patient and family together;Health care provider  Visit Type Psychological support;Spiritual support;Social support;Patient actively dying  Referral From Nurse  Consult/Referral To Chaplain  Spiritual Encounters  Spiritual Needs Emotional;Grief support  Stress Factors  Patient Stress Factors Health changes;Loss;Loss of control;Major life changes  Family Stress Factors Loss of control;Loss;Major life changes;Family relationships

## 2019-02-21 NOTE — Progress Notes (Signed)
Pt has remained on bipap 100% FiO2- SpO2 88-90% initially; however, pt has decompensated over the course of the morning- SpO2 currently 77% on bipap 100% FiO2.  Pt has remained obtunded with response (moan) to pain. Pt has remained in A.Fib on cardiac monitor- rate 100-130. BP WNL.  Son, Mariah Nixon, has been updated from Dr Sung Amabile. Son, Mariah Nixon, has also been made aware of mothers' decline as of 1230pm. He was requested to relay correspondence with brother and aunt.

## 2019-02-22 LAB — CBC
HCT: 42.2 % (ref 36.0–46.0)
Hemoglobin: 12.9 g/dL (ref 12.0–15.0)
MCH: 32.7 pg (ref 26.0–34.0)
MCHC: 30.6 g/dL (ref 30.0–36.0)
MCV: 107.1 fL — ABNORMAL HIGH (ref 80.0–100.0)
Platelets: 335 10*3/uL (ref 150–400)
RBC: 3.94 MIL/uL (ref 3.87–5.11)
RDW: 12.5 % (ref 11.5–15.5)
WBC: 30.9 10*3/uL — ABNORMAL HIGH (ref 4.0–10.5)
nRBC: 0.1 % (ref 0.0–0.2)

## 2019-02-22 LAB — COMPREHENSIVE METABOLIC PANEL
ALT: 18 U/L (ref 0–44)
AST: 39 U/L (ref 15–41)
Albumin: 3 g/dL — ABNORMAL LOW (ref 3.5–5.0)
Alkaline Phosphatase: 101 U/L (ref 38–126)
Anion gap: 9 (ref 5–15)
BUN: 40 mg/dL — ABNORMAL HIGH (ref 8–23)
CO2: 24 mmol/L (ref 22–32)
Calcium: 8.4 mg/dL — ABNORMAL LOW (ref 8.9–10.3)
Chloride: 100 mmol/L (ref 98–111)
Creatinine, Ser: 1.82 mg/dL — ABNORMAL HIGH (ref 0.44–1.00)
GFR calc Af Amer: 31 mL/min — ABNORMAL LOW (ref >60)
GFR calc non Af Amer: 27 mL/min — ABNORMAL LOW (ref >60)
Glucose, Bld: 95 mg/dL (ref 70–99)
Potassium: 5.6 mmol/L — ABNORMAL HIGH (ref 3.5–5.1)
Sodium: 133 mmol/L — ABNORMAL LOW (ref 135–145)
Total Bilirubin: 0.4 mg/dL (ref 0.3–1.2)
Total Protein: 6.6 g/dL (ref 6.5–8.1)

## 2019-02-22 LAB — LEGIONELLA PNEUMOPHILA SEROGP 1 UR AG: L. pneumophila Serogp 1 Ur Ag: NEGATIVE

## 2019-02-22 LAB — PROCALCITONIN: Procalcitonin: 0.45 ng/mL

## 2019-02-22 MED ORDER — MORPHINE SULFATE (PF) 2 MG/ML IV SOLN
2.0000 mg | INTRAVENOUS | Status: DC | PRN
  Administered 2019-02-22: 2 mg via INTRAVENOUS
  Filled 2019-02-22: qty 1

## 2019-02-26 ENCOUNTER — Telehealth: Payer: Self-pay | Admitting: Pulmonary Disease

## 2019-02-26 NOTE — Telephone Encounter (Signed)
Death Certificate received and placed in DS box for completion.

## 2019-02-26 NOTE — Telephone Encounter (Signed)
Death Certificate completed. [employer] aware.  

## 2019-03-14 NOTE — Death Summary Note (Addendum)
DEATH SUMMARY   Patient Details  Name: Mariah Nixon MRN: [id number] DOB: October 17, 1946  Admission/Discharge Information   Admit Date:  03-07-19  Date of Death: Date of Death: 03-12-2019  Time of Death: Time of Death: Mar 17, 1245  Length of Stay: 5  Referring Physician: Center, [facility]   Reason(s) for Hospitalization  Pneumonia  Diagnoses  Preliminary cause of death: Pneumonia Secondary Diagnoses (including complications and co-morbidities):  Active Problems:   Hyponatremia COPD Acute delirium  Brief Hospital Course (including significant findings, care, treatment, and services provided and events leading to death)  Mariah Nixon is a 73 y.o. year old female who 73 year old female patient with history of COPD, hypertension, tobacco abuse presented to emergency room with hip pain.Imaging studies showed no fracture.Work-up showed low sodium level of 112.  1.Acute on chronic hypoxic respiratory failure:likely secondary to COPD exacerbation and pneumonia.  2. Acute delirium- Likely hospital-acquired delirium.  3. Acute hyponatremia -Combination of hypovolemia on ad, SIADH associated with significant COPD and superimposed pneumonia.  4. Community-acquired pneumonia with worsening leukocytosis-   Pertinent Labs and Studies  Significant Diagnostic Studies Dg Chest 1 View  Result Date: 02/19/2019 CLINICAL DATA:  Shortness of breath. History of COPD and hypertension. EXAM: CHEST  1 VIEW COMPARISON:  PA and lateral chest 03-07-2019. CT chest 11/12/2018 and 02/18/2019. FINDINGS: The patient has new pulmonary edema. Left pleural effusion and basilar airspace disease are also now identified. Small right pleural effusion appears decreased. There is cardiomegaly. Atherosclerosis is noted. Severe scoliosis is seen. IMPRESSION: New interstitial pulmonary edema. Increased left effusion and bibasilar airspace disease worrisome for pneumonia. Atherosclerosis. Electronically Signed   By:  Drusilla Kanner M.D.   On: 02/19/2019 10:49   Dg Chest 2 View  Result Date: 2019-03-07 CLINICAL DATA:  Back pain 1 week.  No injury.  Difficulty breathing. EXAM: CHEST - 2 VIEW COMPARISON:  Chest CT 11/12/2018 FINDINGS: Lungs are adequately inflated demonstrate bibasilar opacification with small right pleural effusion. Findings may be due to infection. There is cardiomegaly as well as mild prominence of the perihilar markings suggesting mild vascular congestion. Old left lower posterior rib fractures. Severe curvature of the thoracolumbar spine convex right with degenerative changes. IMPRESSION: Bibasilar opacification which may be due to infection with small right pleural effusion. Possible component of mild vascular congestion. Cardiomegaly. Electronically Signed   By: Elberta Fortis M.D.   On: 03/07/19 16:36   Dg Lumbar Spine Complete  Result Date: 2019/03/07 CLINICAL DATA:  Low back pain 1 week.  No injury. EXAM: LUMBAR SPINE - COMPLETE 4+ VIEW COMPARISON:  None. FINDINGS: There is severe curvature of the thoracolumbar spine convex right. It is difficult to evaluate the lateral films due to the significant curvature present. There is moderate spondylosis of the spine. No definite compression fracture or subluxation. Calcified T11-12 disc. IMPRESSION: No acute findings. 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 Electronically Signed   By: Elberta Fortis M.D.   On: 03-07-2019 16:29   Ct Chest Wo Contrast  Result Date: 02/18/2019 CLINICAL DATA:  Hyponatremia workup. Abnormal chest x-ray. No fever. EXAM: CT CHEST WITHOUT CONTRAST TECHNIQUE: Multidetector CT imaging of the chest was performed following the standard protocol without IV contrast. COMPARISON:  11/12/2018 screening chest CT FINDINGS: Cardiovascular: No cardiomegaly. There is a moderate low-density pericardial effusion. Extensive atherosclerotic calcification of the aorta and coronaries.  Mediastinum/Nodes: Borderline paratracheal adenopathy which is presumably reactive in this setting. Lungs/Pleura: Volume loss and streaky opacity in  the lower lungs, especially middle lobe and lingula. There is mild patchy consolidative and ground-glass opacity in the upper lungs. Small pleural effusions. Upper Abdomen: Distortion from scoliosis.  No acute finding Musculoskeletal: Severe dextroscoliosis centered at the thoracolumbar spine. No acute or aggressive finding. IMPRESSION: 1. Multifocal lung opacity with similar pattern to that seen by CT 11/12/2018. This may reflect recurrent pneumonia (including atypical pathogens) or chronic lung disease. There are trace pleural effusions. 2. Moderate low-density pericardial effusion. 3. Severe scoliosis. Electronically Signed   By: Marnee SpringJonathon  Watts M.D.   On: 02/18/2019 08:46   Dg Chest Port 1 View  Result Date: 02/21/2019 CLINICAL DATA:  Respiratory failure. EXAM: PORTABLE CHEST 1 VIEW COMPARISON:  02/19/2019 FINDINGS: Marked radiographic worsening. There is now subtotal opacification of the left hemithorax consistent with accumulating pleural effusion and left lung atelectasis and or pneumonia as well as possibly edema. Worsened density in the right chest with some increase in pleural fluid, and diffuse pulmonary opacity which could be pneumonia or edema. IMPRESSION: Marked radiographic worsening. Large left effusion. Left lung atelectasis and or infiltrate. There could be pulmonary edema as well. Worsening on the right, with small amount of fluid and edema or. Electronically Signed   By: Paulina FusiMark  Shogry M.D.   On: 02/21/2019 10:28   Dg Hip Unilat W Or Wo Pelvis 2-3 Views Right  Result Date: 02/25/2019 CLINICAL DATA:  Right hip and back pain 1 week.  No injury. EXAM: DG HIP (WITH OR WITHOUT PELVIS) 2-3V RIGHT COMPARISON:  None. FINDINGS: Diffuse decreased bone mineralization is present. There mild symmetric degenerative changes of the hips. There is no acute  fracture or dislocation. Moderate degenerative change over the lumbosacral spine. Calcified plaque over the iliac and femoral arteries. IMPRESSION: No acute findings. Electronically Signed   By: Elberta Fortisaniel  Boyle M.D.   On: October 27, 2019 16:25    Microbiology Recent Results (from the past 240 hour(s))  MRSA PCR Screening     Status: None   Collection Time: 02/19/19 12:27 PM  Result Value Ref Range Status   MRSA by PCR NEGATIVE NEGATIVE Final    Comment:        The GeneXpert MRSA Assay (FDA approved for NASAL specimens only), is one component of a comprehensive MRSA colonization surveillance program. It is not intended to diagnose MRSA infection nor to guide or monitor treatment for MRSA infections. Performed at [facility], [address] [place], [place] [address]     Lab Basic Metabolic Panel: Recent Labs  Lab 2019-09-10 [phone]  02/19/19 0444  02/19/19 1543 02/20/19 0333 02/20/19 1515 02/21/19 0357 03/13/2019 0547  NA 112*   < > 125*   < > 125* 128* 126* 130* 133*  K 4.2  --  4.3  --   --  5.0  --  5.2* 5.6*  CL 75*  --  87*  --   --  90*  --  96* 100  CO2 23  --  25  --   --  25  --  25 24  GLUCOSE 105*  --  164*  --   --  126*  --  150* 95  BUN 6*  --  9  --   --  16  --  28* 40*  CREATININE <0.30*  --  0.47  --   --  0.54  --  1.06* 1.82*  CALCIUM 8.6*  --  8.5*  --   --  9.1  --  8.3* 8.4*   < > =  values in this interval not displayed.   Liver Function Tests: Recent Labs  Lab 02/26/2019 1638 Feb 24, 2019 0547  AST 26 39  ALT 13 18  ALKPHOS 115 101  BILITOT 1.4* 0.4  PROT 7.0 6.6  ALBUMIN 3.2* 3.0*   No results for input(s): LIPASE, AMYLASE in the last 168 hours. No results for input(s): AMMONIA in the last 168 hours. CBC: Recent Labs  Lab 03/07/2019 1638 02/18/19 0659 02/20/19 0333 02/21/19 0357 02-24-2019 0547  WBC 13.2* 13.9* 16.9* 33.6* 30.9*  NEUTROABS 10.1*  --   --   --   --   HGB 13.6 12.9 12.6 11.7* 12.9  HCT 37.6 36.0 37.7 37.5 42.2  MCV 89.7  91.1 99.0 103.0* 107.1*  PLT 349 360 374 372 335   Cardiac Enzymes: No results for input(s): CKTOTAL, CKMB, CKMBINDEX, TROPONINI in the last 168 hours. Sepsis Labs: Recent Labs  Lab 02/13/2019 1949 02/18/19 0659 02/20/19 0333 02/21/19 0357 02/24/19 0547  PROCALCITON <0.10  --   --   --  0.45  WBC  --  13.9* 16.9* 33.6* 30.9*    Procedures/Operations     Tenia Goh A Nalany Steedley 24-Feb-2019, 3:13 PM

## 2019-03-14 NOTE — Progress Notes (Signed)
   02/25/2019 1200  Clinical Encounter Type  Visited With Family  Visit Type Initial;Follow-up  Referral From Nurse  Consult/Referral To Chaplain  Spiritual Encounters  Spiritual Needs Prayer  Stress Factors  Family Stress Factors Loss  Chaplain received page from nurse, patient was becoming comfort care. Chaplain visit with family and listen to memories of patient. Chaplain ended the visit with prayer and sister and son of patient was so appreciative. The patient sister acknowledged how she could feel the Chaplain's love and care for them during this time.

## 2019-03-14 NOTE — Progress Notes (Signed)
[facility] - [place] [facility]   PATIENT NAME: Mariah Nixon    MR#:  [id number]  DATE OF BIRTH:  05/19/1946  SUBJECTIVE:  CHIEF COMPLAINT:   Chief Complaint  Patient presents with  . Hip Pain  critically sick, actively dying REVIEW OF SYSTEMS:   Unable to accurately assess due to fluctuating mental status DRUG ALLERGIES:   Allergies  Allergen Reactions  . Ibuprofen Hives    VITALS:  Blood pressure (!) 81/59, pulse 93, temperature 97.9 F (36.6 C), temperature source Axillary, resp. rate (!) 0, height 4\' 6"  (1.372 m), weight 39.6 kg, SpO2 91 %.  PHYSICAL EXAMINATION:  Physical Exam  GENERAL:  73 y.o.-year-old patient having apneic breath - actively dying EYES: Pupils equal, round, reactive to light and accommodation. No scleral icterus. Extraocular muscles intact.  HEENT: Head atraumatic, normocephalic. Oropharynx and nasopharynx clear.  NECK:  Supple, no jugular venous distention. No thyroid enlargement, no tenderness.  LUNGS: Apneic breaths - actively dying CARDIOVASCULAR: S1, S2 normal. No murmurs, rubs, or gallops.  ABDOMEN: Soft, nontender, nondistended. Bowel sounds present. No organomegaly or mass.  EXTREMITIES: mild pedal and ankle edema, cyanosis, or clubbing.  NEUROLOGIC: Cranial nerves II through XII are intact. Muscle strength 5/5 in all extremities. Sensation intact. Gait not checked.  PSYCHIATRIC: unable to assess - The patient is actively dying.  SKIN: No obvious rash, lesion, or ulcer.  LABORATORY PANEL:   CBC Recent Labs  Lab Nov 06, 2019 0547  WBC 30.9*  HGB 12.9  HCT 42.2  PLT 335   ------------------------------------------------------------------------------------------------------------------  Chemistries  Recent Labs  Lab Nov 06, 2019 0547  NA 133*  K 5.6*  CL 100  CO2 24  GLUCOSE 95  BUN 40*  CREATININE 1.82*  CALCIUM 8.4*  AST 39  ALT 18  ALKPHOS 101  BILITOT 0.4    ------------------------------------------------------------------------------------------------------------------  Cardiac Enzymes No results for input(s): TROPONINI in the last 168 hours. ------------------------------------------------------------------------------------------------------------------  RADIOLOGY:  Dg Chest Port 1 View  Result Date: 02/21/2019 CLINICAL DATA:  Respiratory failure. EXAM: PORTABLE CHEST 1 VIEW COMPARISON:  02/19/2019 FINDINGS: Marked radiographic worsening. There is now subtotal opacification of the left hemithorax consistent with accumulating pleural effusion and left lung atelectasis and or pneumonia as well as possibly edema. Worsened density in the right chest with some increase in pleural fluid, and diffuse pulmonary opacity which could be pneumonia or edema. IMPRESSION: Marked radiographic worsening. Large left effusion. Left lung atelectasis and or infiltrate. There could be pulmonary edema as well. Worsening on the right, with small amount of fluid and edema or. Electronically Signed   By: Paulina FusiMark  Shogry M.D.   On: 02/21/2019 10:28    EKG:  No orders found for this or any previous visit.  ASSESSMENT AND PLAN:  73 year old female patient with history of COPD, hypertension, tobacco abuse presented to emergency room with hip pain.Imaging studies showed no fracture.Work-up showed low sodium level of 112.  1.Acute on chronic hypoxic respiratory failure:likely secondary to COPD exacerbation and pneumonia.  2. Acute delirium-  Likely hospital-acquired delirium.  3. Acute hyponatremia -  Combination of hypovolemia on ad, SIADH associated with significant COPD and superimposed pneumonia.  4. Community-acquired pneumonia with worsening leukocytosis-    Comfort care, actively dying.    All the records are reviewed and case discussed with Care Management/Social Workerr. Management plans discussed with the patient, PCCM and they are in agreement.  CODE  STATUS: DNR  TOTAL TIME TAKING CARE OF THIS PATIENT: 10 minutes.   POSSIBLE D/C  IN ? DAYS, DEPENDING ON CLINICAL CONDITION.   Delfino Lovett M.D on 03/05/2019 at 2:26 PM  Between 7am to 6pm - Pager - [phone]  After 6pm go to [email or web address] - Social research officer, government  [facility]  Office  [phone]  CC: Primary care physician; Center, [facility]

## 2019-03-14 DEATH — deceased

## 2019-10-06 IMAGING — CT CT CHEST WITHOUT CONTRAST
2 of 3 series · 15 of 36 positions shown, 18 images · non-contrast
Comparison: 11/12/2018 screening chest CT

CLINICAL DATA: Hyponatremia workup. Abnormal chest x-ray. No fever.

EXAM:
CT CHEST WITHOUT CONTRAST
TECHNIQUE: Multidetector CT imaging of the chest was performed following the
standard protocol without IV contrast.

[Series 2: thorax · axial · 0.55mm/px · z∈[+120,+322]mm · 12 of 119 slices shown, 15 images]
[im 9/119  mediastinal]
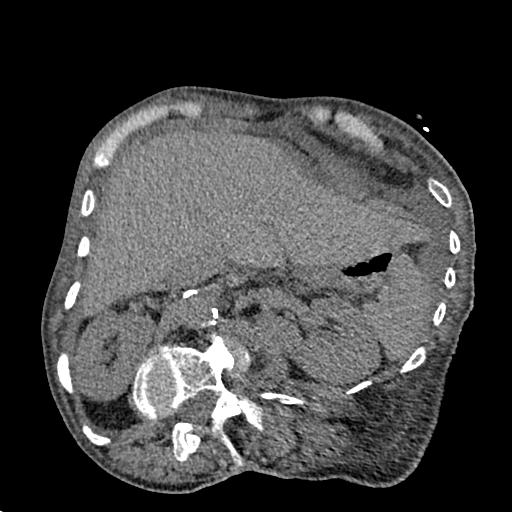
[im 9/119  lung]
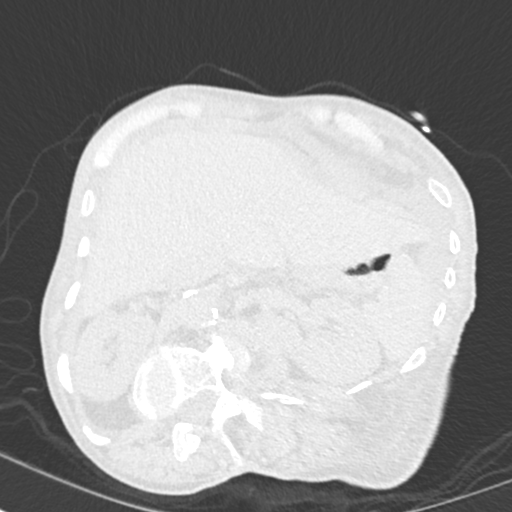
[im 18/119  lung]
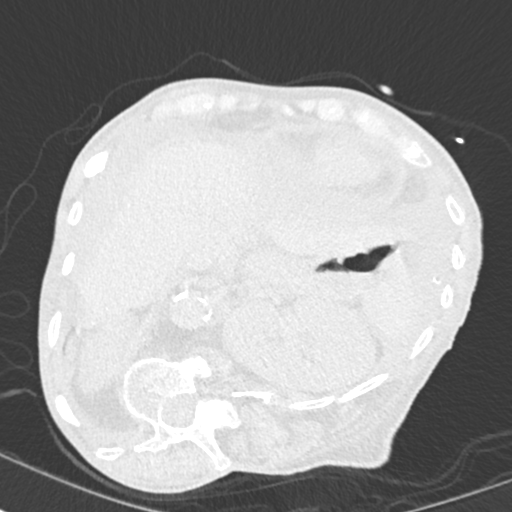
[im 27/119  lung]
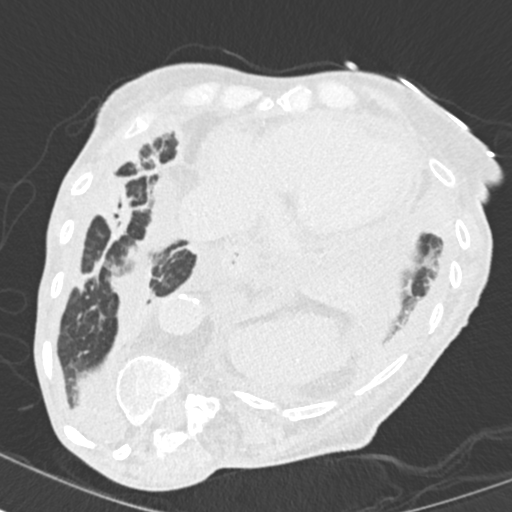
[im 35/119  lung]
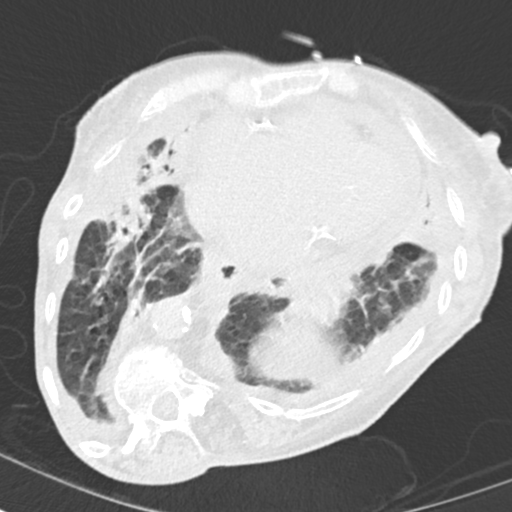
[im 44/119  mediastinal]
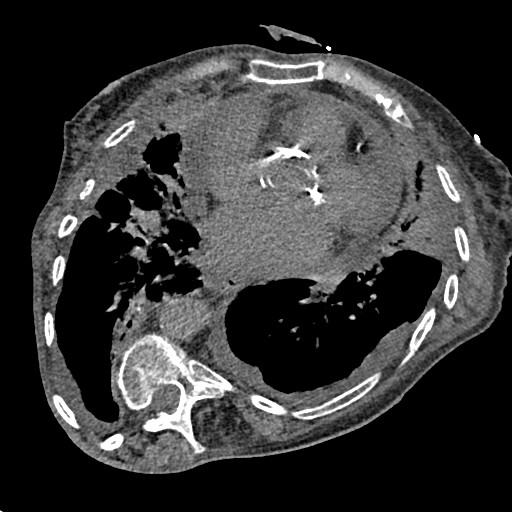
[im 44/119  lung]
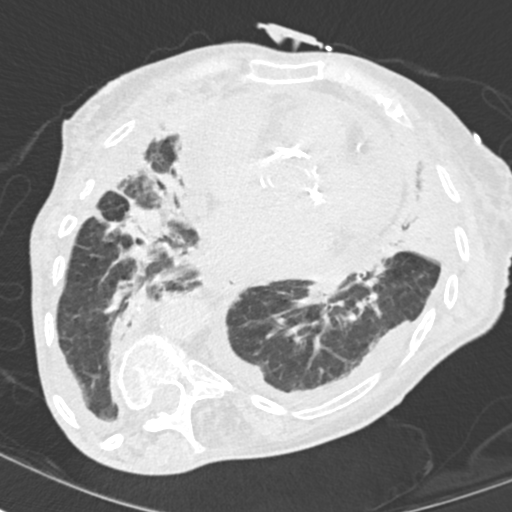
[im 53/119  lung]
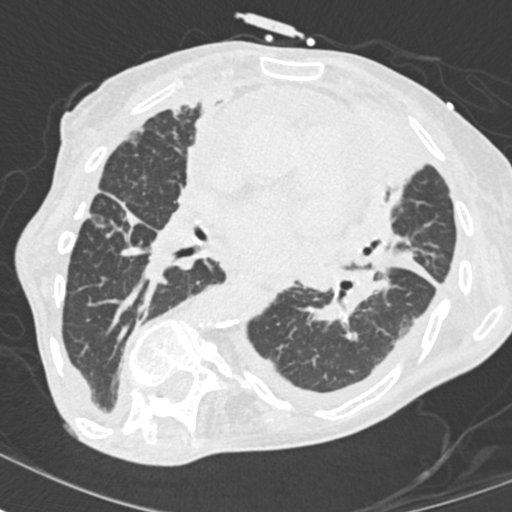
[im 66/119  lung]
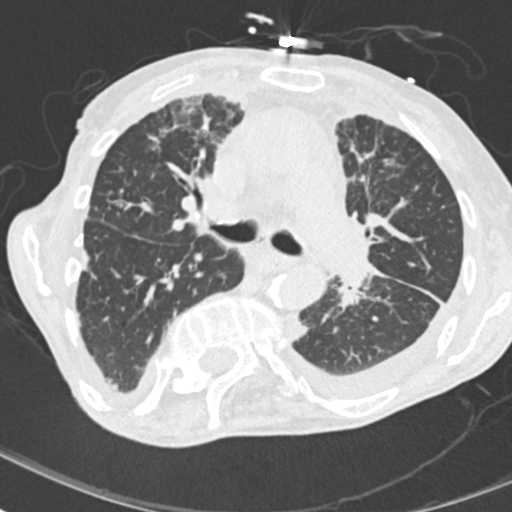
[im 75/119  lung]
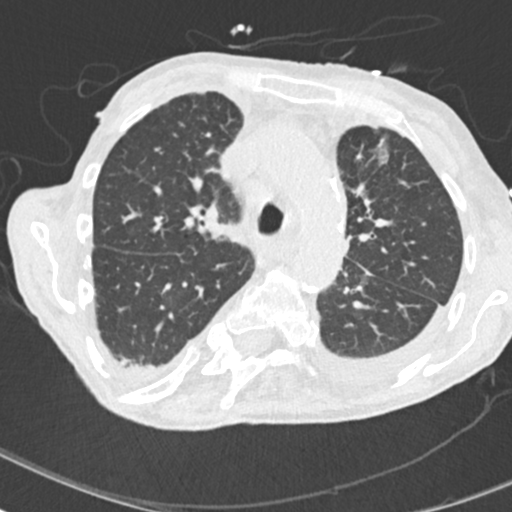
[im 84/119  mediastinal]
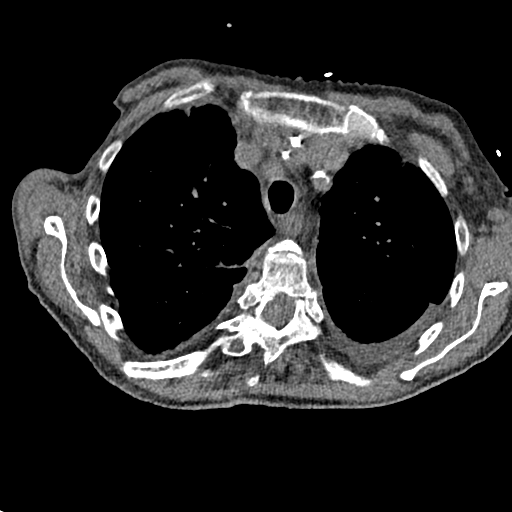
[im 84/119  lung]
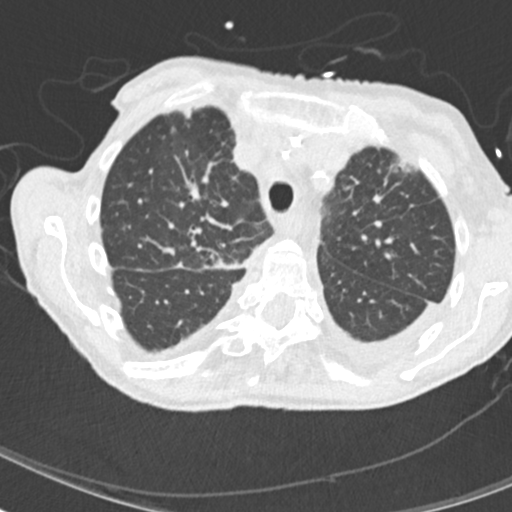
[im 92/119  lung]
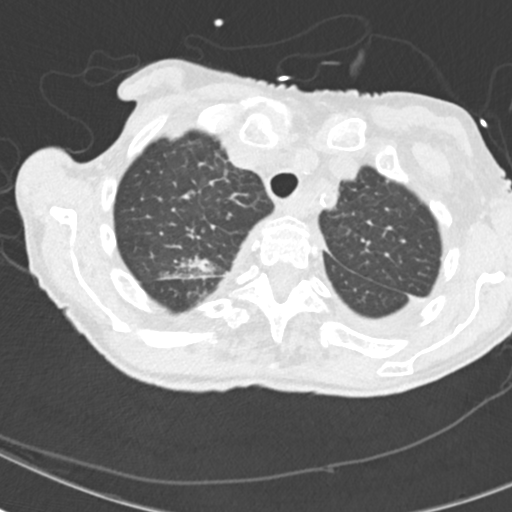
[im 101/119  lung]
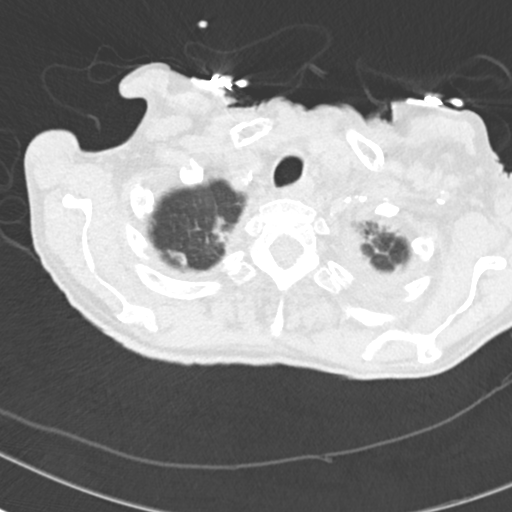
[im 110/119  lung]
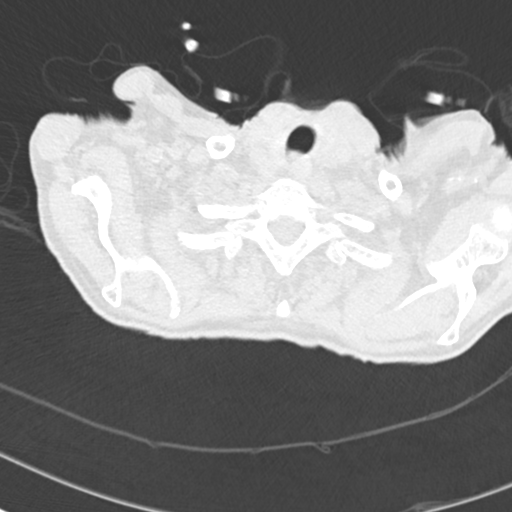

[Series 5: coronal · coronal · 0.48mm/px · 3 of 118 slices shown]
[im 24/118  lung]
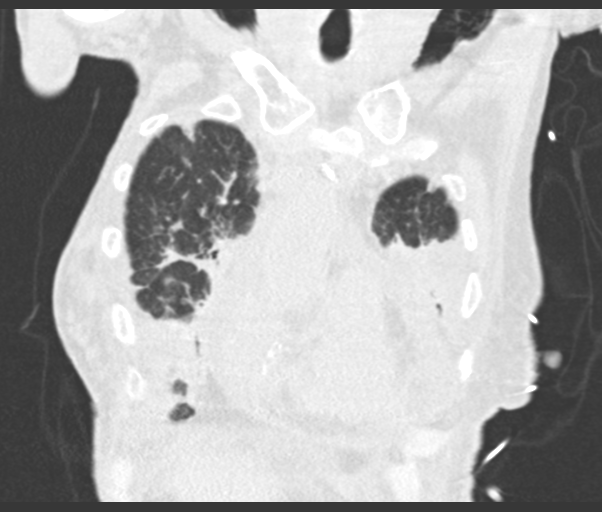
[im 47/118  lung]
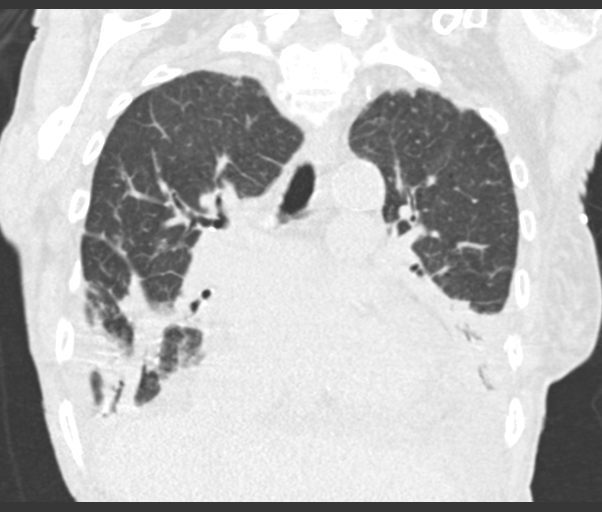
[im 71/118  lung]
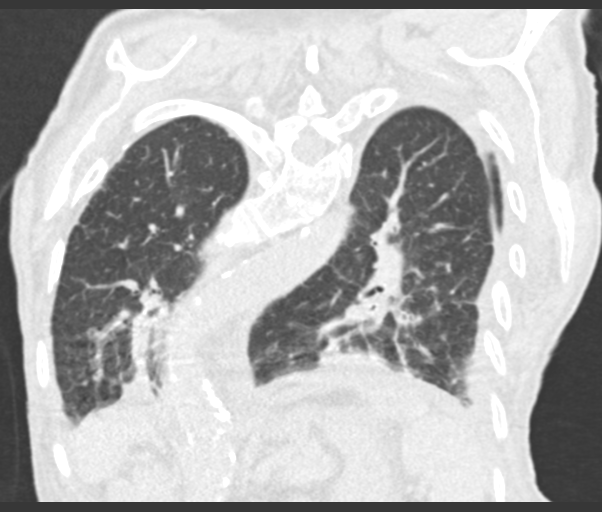

[15 of 36 positions shown; findings below may reference images not displayed]

FINDINGS: Cardiovascular: No cardiomegaly. There is a moderate low-density
pericardial effusion. Extensive atherosclerotic calcification of the
aorta and coronaries.

Mediastinum/Nodes: Borderline paratracheal adenopathy which is
presumably reactive in this setting.

Lungs/Pleura: Volume loss and streaky opacity in the lower lungs,
especially middle lobe and lingula. There is mild patchy
consolidative and ground-glass opacity in the upper lungs. Small
pleural effusions.

Upper Abdomen: Distortion from scoliosis.  No acute finding

Musculoskeletal: Severe dextroscoliosis centered at the
thoracolumbar spine. No acute or aggressive finding.
IMPRESSION: 1. Multifocal lung opacity with similar pattern to that seen by CT
11/12/2018. This may reflect recurrent pneumonia (including atypical
pathogens) or chronic lung disease. There are trace pleural
effusions.
2. Moderate low-density pericardial effusion.
3. Severe scoliosis.

## 2019-10-07 IMAGING — DX CHEST  1 VIEW
1 series · 1 of 1 positions shown · non-contrast
Comparison: PA and lateral chest 02/17/2019. CT chest 11/12/2018
and 02/18/2019.

CLINICAL DATA: Shortness of breath. History of COPD and
hypertension.

EXAM:
CHEST  1 VIEW

[chest ap]
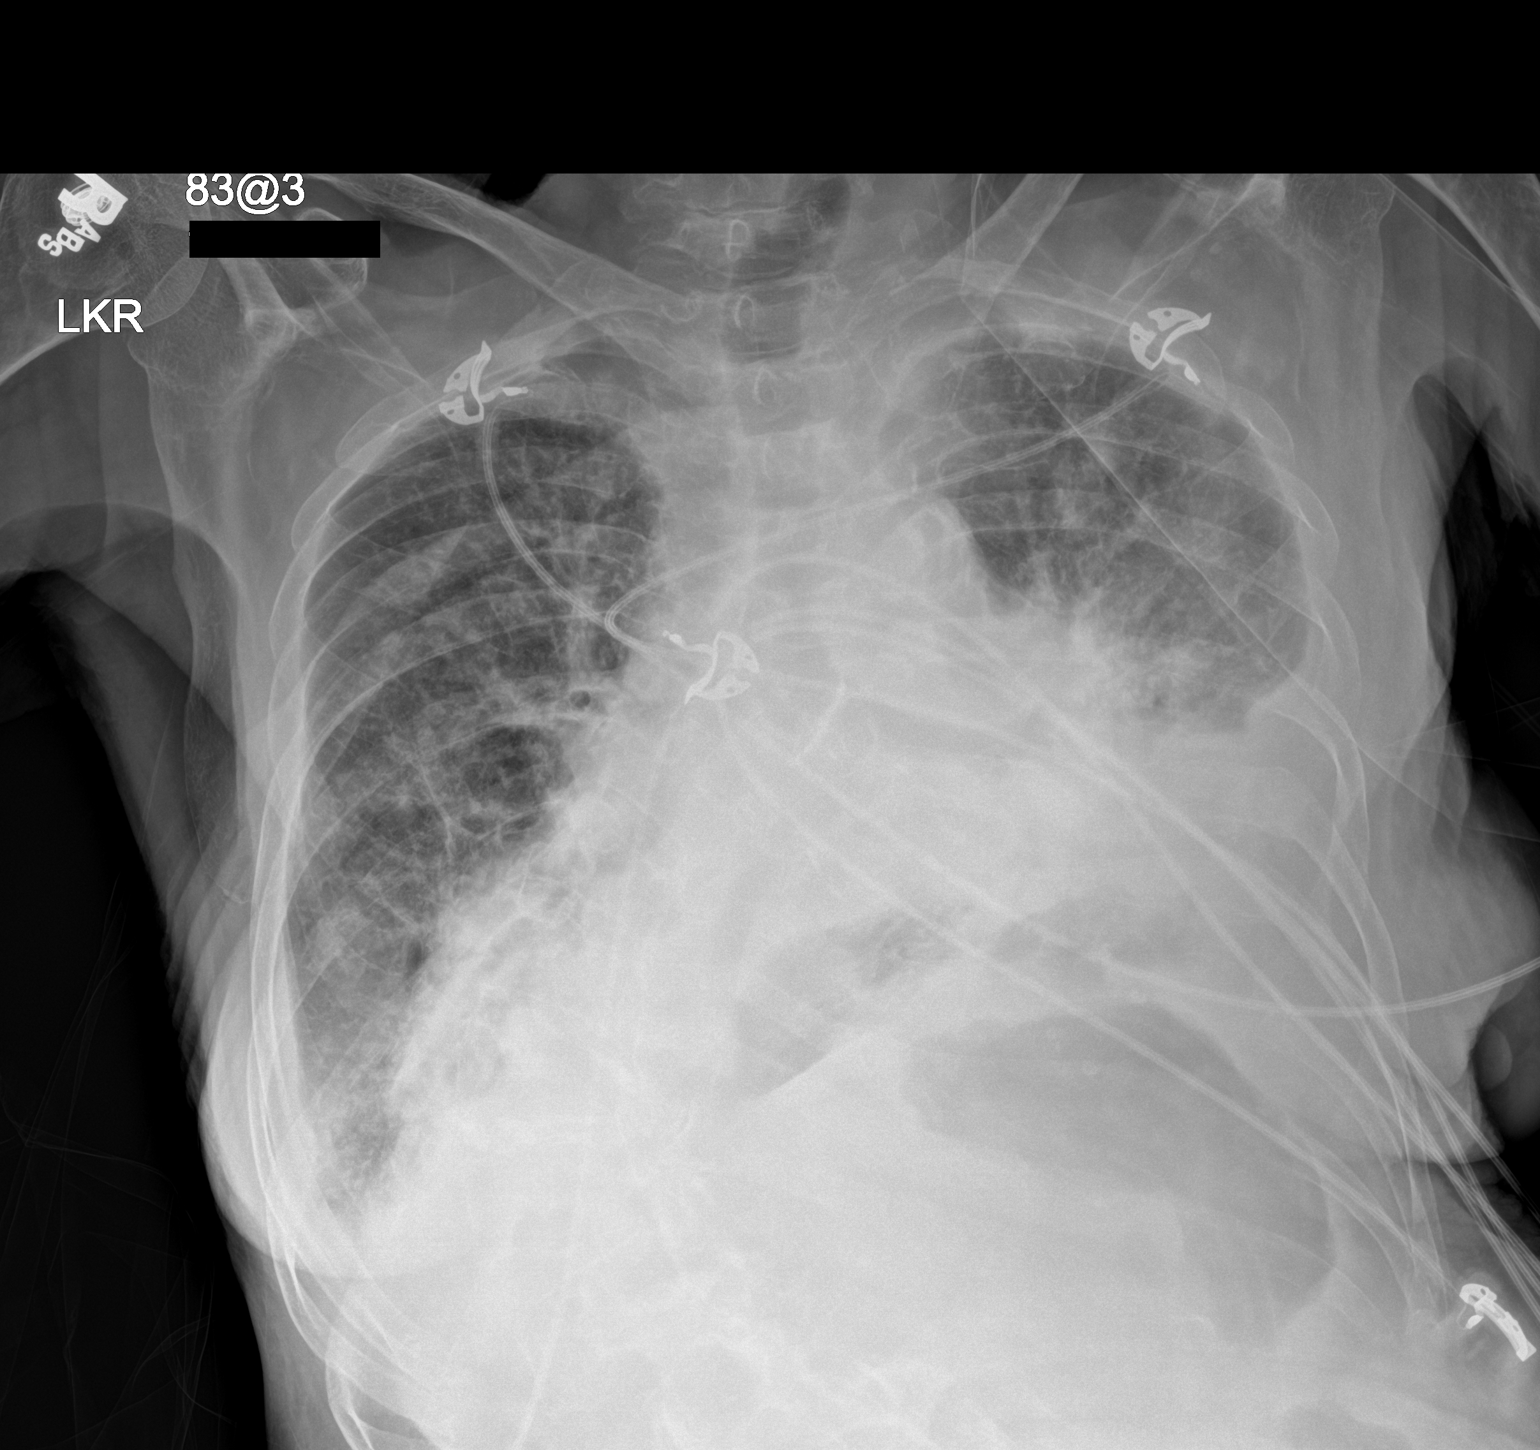

[1 of 1 positions shown; findings below may reference images not displayed]

FINDINGS: The patient has new pulmonary edema. Left pleural effusion and
basilar airspace disease are also now identified. Small right
pleural effusion appears decreased. There is cardiomegaly.
Atherosclerosis is noted. Severe scoliosis is seen.
IMPRESSION: New interstitial pulmonary edema.

Increased left effusion and bibasilar airspace disease worrisome for
pneumonia.

Atherosclerosis.

## 2019-10-09 IMAGING — DX PORTABLE CHEST - 1 VIEW
1 series · 1 of 1 positions shown · non-contrast
Comparison: 02/19/2019

CLINICAL DATA: Respiratory failure.

EXAM:
PORTABLE CHEST 1 VIEW

[chest ap]
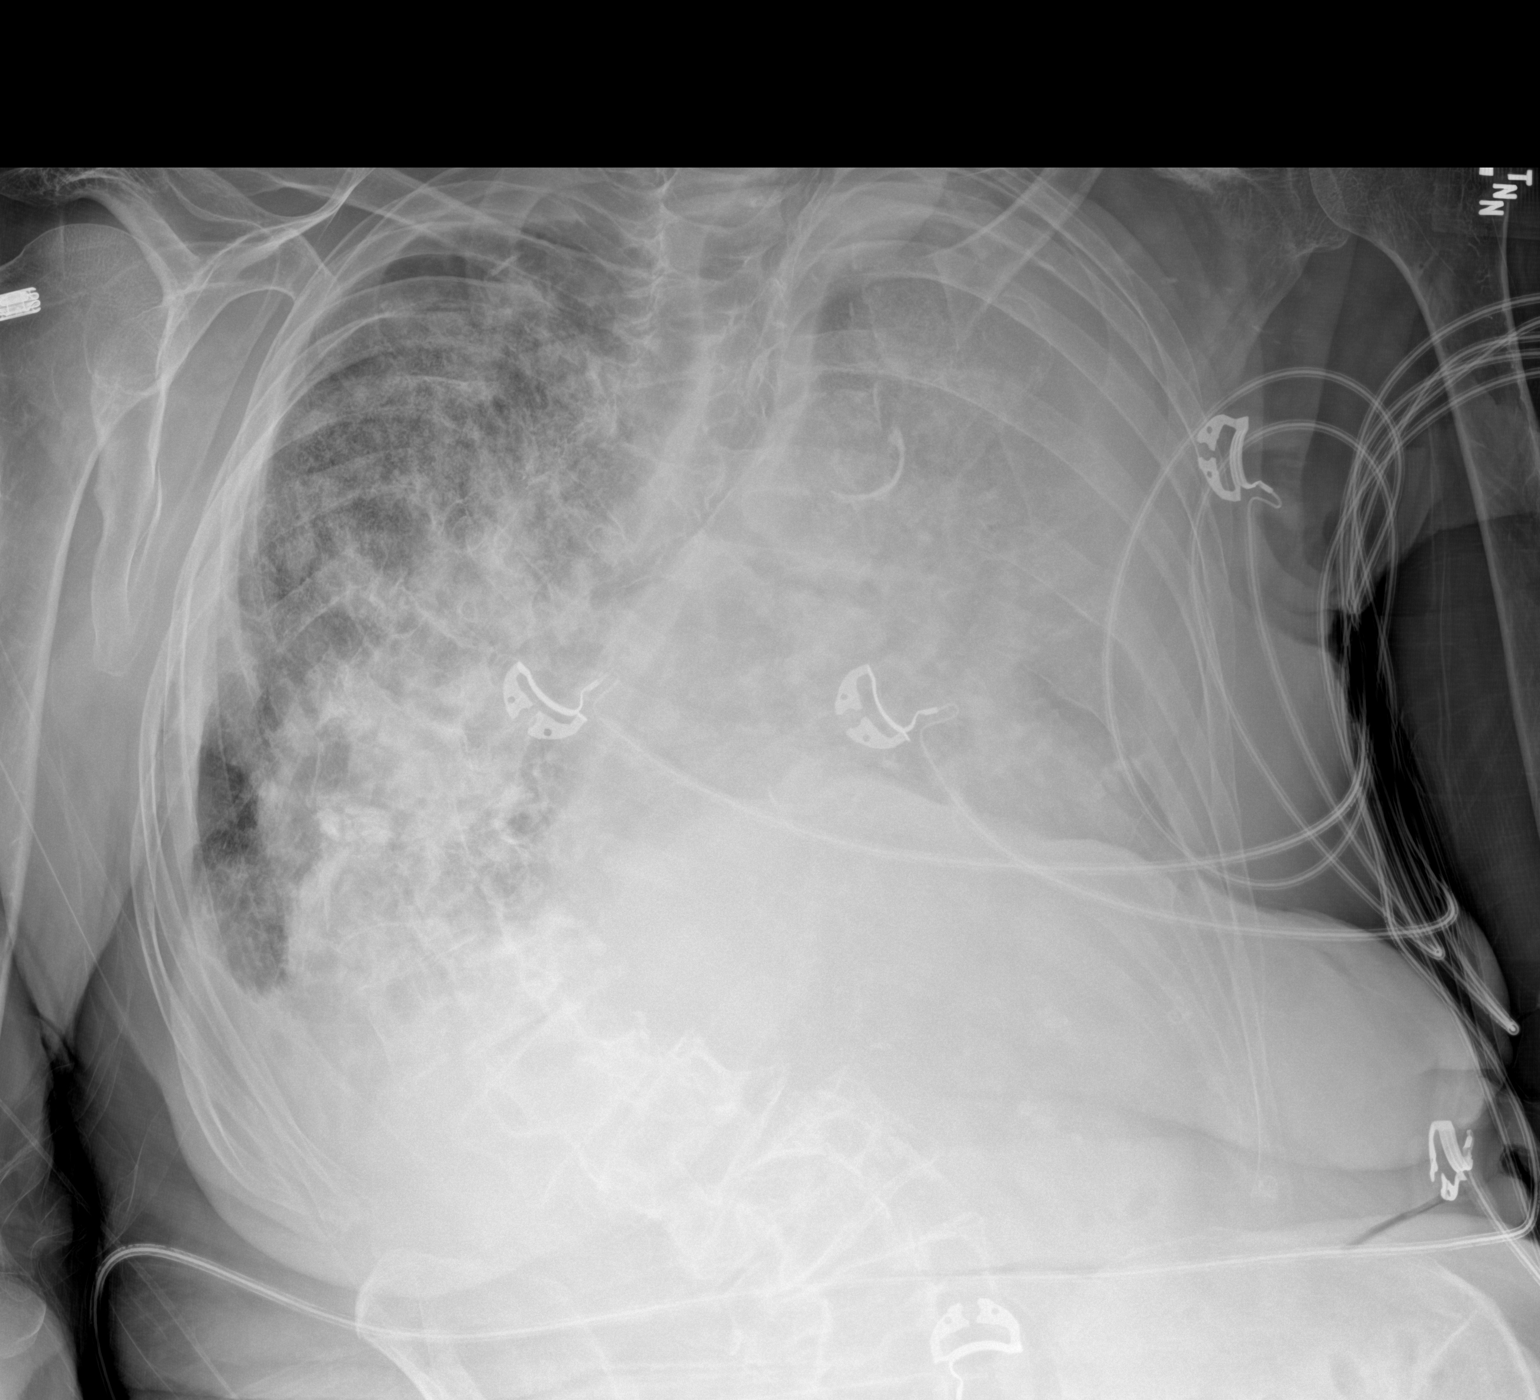

[1 of 1 positions shown; findings below may reference images not displayed]

FINDINGS: Marked radiographic worsening. There is now subtotal opacification
of the left hemithorax consistent with accumulating pleural effusion
and left lung atelectasis and or pneumonia as well as possibly
edema. Worsened density in the right chest with some increase in
pleural fluid, and diffuse pulmonary opacity which could be
pneumonia or edema.
IMPRESSION: Marked radiographic worsening. Large left effusion. Left lung
atelectasis and or infiltrate. There could be pulmonary edema as
well. Worsening on the right, with small amount of fluid and edema
or.
